# Patient Record
Sex: Female | Born: 1965 | Race: Black or African American | Hispanic: No | Smoking: Never smoker
Health system: Southern US, Community
[De-identification: ages and names within clinical notes are randomized; demographics above are authoritative.]

## PROBLEM LIST (undated history)

## (undated) DIAGNOSIS — E119 Type 2 diabetes mellitus without complications: Secondary | ICD-10-CM

## (undated) DIAGNOSIS — K5792 Diverticulitis of intestine, part unspecified, without perforation or abscess without bleeding: Secondary | ICD-10-CM

## (undated) HISTORY — DX: Diverticulitis of intestine, part unspecified, without perforation or abscess without bleeding: K57.92

## (undated) HISTORY — PX: TOTAL ABDOMINAL HYSTERECTOMY: SHX209

## (undated) HISTORY — DX: Type 2 diabetes mellitus without complications: E11.9

## (undated) HISTORY — PX: ABDOMINAL HYSTERECTOMY: SHX81

## (undated) HISTORY — PX: APPENDECTOMY: SHX54

---

## 2005-08-08 ENCOUNTER — Ambulatory Visit: Payer: Self-pay | Admitting: Family Medicine

## 2005-08-26 ENCOUNTER — Ambulatory Visit: Payer: Self-pay | Admitting: Family Medicine

## 2011-08-25 ENCOUNTER — Emergency Department: Payer: Self-pay | Admitting: Emergency Medicine

## 2017-02-11 ENCOUNTER — Ambulatory Visit: Payer: Self-pay | Attending: Oncology

## 2017-02-11 ENCOUNTER — Encounter (INDEPENDENT_AMBULATORY_CARE_PROVIDER_SITE_OTHER): Payer: Self-pay

## 2017-02-11 ENCOUNTER — Ambulatory Visit
Admission: RE | Admit: 2017-02-11 | Discharge: 2017-02-11 | Disposition: A | Discharge status: Home / Self Care | Payer: Self-pay | Source: Ambulatory Visit | Attending: Oncology | Admitting: Oncology

## 2017-02-11 VITALS — BP 126/83 | HR 89 | Temp 97.9°F | Ht 63.39 in | Wt 212.4 lb

## 2017-02-11 DIAGNOSIS — Z0000 Encounter for general adult medical examination without abnormal findings: Secondary | ICD-10-CM

## 2017-02-11 NOTE — Progress Notes (Signed)
Subjective:     Patient ID: Dimitri Ped, female   DOB: 1966-07-15, 51 y.o.   MRN: [id number]  HPI   Review of Systems     Objective:   Physical Exam  Pulmonary/Chest: Right breast exhibits no inverted nipple, no mass, no nipple discharge, no skin change and no tenderness. Left breast exhibits no inverted nipple, no mass, no nipple discharge, no skin change and no tenderness. Breasts are asymmetrical.  Right breast smaller than left       Assessment:     51 year old patient presents for [facility] clinic visit.  No previous mammogram. Patient screened, and meets BCCCP eligibility.  Patient does not have insurance, Medicare or Medicaid.  Handout given on Affordable Care Act.  Instructed patient on breast self-exam using teach back method.  CBE unremarkable.  No mass or lump palpated.  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    Plan:     Sent for bilateral screening mammogram.

## 2017-02-28 ENCOUNTER — Other Ambulatory Visit: Payer: Self-pay | Admitting: *Deleted

## 2017-02-28 DIAGNOSIS — R920 Mammographic microcalcification found on diagnostic imaging of breast: Secondary | ICD-10-CM

## 2017-02-28 DIAGNOSIS — N63 Unspecified lump in unspecified breast: Secondary | ICD-10-CM

## 2017-03-14 ENCOUNTER — Ambulatory Visit
Admission: RE | Admit: 2017-03-14 | Discharge: 2017-03-14 | Disposition: A | Discharge status: Home / Self Care | Payer: Self-pay | Source: Ambulatory Visit | Attending: Oncology | Admitting: Oncology

## 2017-03-14 DIAGNOSIS — N63 Unspecified lump in unspecified breast: Secondary | ICD-10-CM

## 2017-03-14 DIAGNOSIS — R920 Mammographic microcalcification found on diagnostic imaging of breast: Secondary | ICD-10-CM

## 2017-03-20 ENCOUNTER — Other Ambulatory Visit: Payer: Self-pay

## 2017-03-20 DIAGNOSIS — R920 Mammographic microcalcification found on diagnostic imaging of breast: Secondary | ICD-10-CM

## 2017-03-21 NOTE — Progress Notes (Signed)
Letter mailed to patient to return Thursday 09/19/17 at 11:20 for 6 month follow-up left breast mammogram.

## 2017-08-17 ENCOUNTER — Emergency Department: Payer: Self-pay

## 2017-08-17 ENCOUNTER — Emergency Department
Admission: EM | Admit: 2017-08-17 | Discharge: 2017-08-17 | Disposition: A | Discharge status: Home / Self Care | Payer: Self-pay | Attending: Emergency Medicine | Admitting: Emergency Medicine

## 2017-08-17 ENCOUNTER — Encounter: Payer: Self-pay | Admitting: Emergency Medicine

## 2017-08-17 DIAGNOSIS — R55 Syncope and collapse: Secondary | ICD-10-CM | POA: Insufficient documentation

## 2017-08-17 DIAGNOSIS — E1165 Type 2 diabetes mellitus with hyperglycemia: Secondary | ICD-10-CM | POA: Insufficient documentation

## 2017-08-17 DIAGNOSIS — Z7984 Long term (current) use of oral hypoglycemic drugs: Secondary | ICD-10-CM | POA: Insufficient documentation

## 2017-08-17 DIAGNOSIS — N3001 Acute cystitis with hematuria: Secondary | ICD-10-CM | POA: Insufficient documentation

## 2017-08-17 LAB — URINALYSIS, COMPLETE (UACMP) WITH MICROSCOPIC
BILIRUBIN URINE: NEGATIVE
Ketones, ur: NEGATIVE mg/dL
LEUKOCYTES UA: NEGATIVE
NITRITE: NEGATIVE
Protein, ur: 100 mg/dL — AB
SPECIFIC GRAVITY, URINE: 1.013 (ref 1.005–1.030)
pH: 5 (ref 5.0–8.0)

## 2017-08-17 LAB — HEPATIC FUNCTION PANEL
ALT: 16 U/L (ref 14–54)
AST: 17 U/L (ref 15–41)
Albumin: 3.5 g/dL (ref 3.5–5.0)
Alkaline Phosphatase: 75 U/L (ref 38–126)
TOTAL PROTEIN: 7.2 g/dL (ref 6.5–8.1)
Total Bilirubin: 0.4 mg/dL (ref 0.3–1.2)

## 2017-08-17 LAB — BASIC METABOLIC PANEL
ANION GAP: 9 (ref 5–15)
BUN: 9 mg/dL (ref 6–20)
CO2: 26 mmol/L (ref 22–32)
Calcium: 8.7 mg/dL — ABNORMAL LOW (ref 8.9–10.3)
Chloride: 99 mmol/L — ABNORMAL LOW (ref 101–111)
Creatinine, Ser: 0.79 mg/dL (ref 0.44–1.00)
GFR calc non Af Amer: >60 mL/min (ref >60)
Glucose, Bld: 372 mg/dL — ABNORMAL HIGH (ref 65–99)
Potassium: 4 mmol/L (ref 3.5–5.1)
Sodium: 134 mmol/L — ABNORMAL LOW (ref 135–145)

## 2017-08-17 LAB — CBC WITH DIFFERENTIAL/PLATELET
Basophils Absolute: 0 10*3/uL (ref 0–0.1)
Basophils Relative: 1 %
Eosinophils Absolute: 0 10*3/uL (ref 0–0.7)
Eosinophils Relative: 0 %
HEMATOCRIT: 34.5 % — AB (ref 35.0–47.0)
Hemoglobin: 11.5 g/dL — ABNORMAL LOW (ref 12.0–16.0)
Lymphocytes Relative: 23 %
Lymphs Abs: 2 10*3/uL (ref 1.0–3.6)
MCH: 28.7 pg (ref 26.0–34.0)
MCHC: 33.4 g/dL (ref 32.0–36.0)
MCV: 85.9 fL (ref 80.0–100.0)
MONO ABS: 0.9 10*3/uL (ref 0.2–0.9)
MONOS PCT: 10 %
NEUTROS ABS: 5.6 10*3/uL (ref 1.4–6.5)
Neutrophils Relative %: 66 %
Platelets: 341 10*3/uL (ref 150–440)
RBC: 4.02 MIL/uL (ref 3.80–5.20)
RDW: 13.4 % (ref 11.5–14.5)
WBC: 8.6 10*3/uL (ref 3.6–11.0)

## 2017-08-17 LAB — TSH: TSH: 1.005 u[IU]/mL (ref 0.350–4.500)

## 2017-08-17 LAB — TROPONIN I

## 2017-08-17 LAB — GLUCOSE, CAPILLARY
GLUCOSE-CAPILLARY: 300 mg/dL — AB (ref 65–99)
Glucose-Capillary: 230 mg/dL — ABNORMAL HIGH (ref 65–99)

## 2017-08-17 LAB — FIBRIN DERIVATIVES D-DIMER (ARMC ONLY): Fibrin derivatives D-dimer (ARMC): 449.94 (ref 0.00–499.00)

## 2017-08-17 MED ORDER — SODIUM CHLORIDE 0.9 % IV BOLUS (SEPSIS)
1000.0000 mL | Freq: Once | INTRAVENOUS | Status: AC
  Administered 2017-08-17: 1000 mL via INTRAVENOUS

## 2017-08-17 MED ORDER — METFORMIN HCL 500 MG PO TABS: 500.0000 mg | ORAL_TABLET | Freq: Two times a day (BID) | ORAL | 1 refills | Status: DC

## 2017-08-17 MED ORDER — ONDANSETRON HCL 4 MG/2ML IJ SOLN
4.0000 mg | Freq: Once | INTRAMUSCULAR | Status: AC
  Administered 2017-08-17: 4 mg via INTRAVENOUS
  Filled 2017-08-17: qty 2

## 2017-08-17 MED ORDER — CEPHALEXIN 500 MG PO CAPS: 500.0000 mg | ORAL_CAPSULE | Freq: Three times a day (TID) | ORAL | 0 refills | Status: AC

## 2017-08-17 MED ORDER — CEFTRIAXONE SODIUM IN DEXTROSE 20 MG/ML IV SOLN
1.0000 g | Freq: Once | INTRAVENOUS | Status: AC
  Administered 2017-08-17: 1 g via INTRAVENOUS

## 2017-08-17 MED ORDER — METFORMIN HCL 500 MG PO TABS
500.0000 mg | ORAL_TABLET | Freq: Once | ORAL | Status: AC
  Administered 2017-08-17: 500 mg via ORAL
  Filled 2017-08-17: qty 1

## 2017-08-17 MED ORDER — INSULIN ASPART 100 UNIT/ML ~~LOC~~ SOLN
10.0000 [IU] | Freq: Once | SUBCUTANEOUS | Status: AC
  Administered 2017-08-17: 10 [IU] via INTRAVENOUS
  Filled 2017-08-17: qty 1

## 2017-08-17 NOTE — Discharge Instructions (Signed)
Take antibiotics as prescribed. Take metformin twice a day with meals. Follow up with your doctor in 1-2 days for further management of your diabetes. Return to the ER for chest pain, palpitations, shortness of breath, headache, fever, or any symptoms concerning to you. Follow up with cardiology if you continue to have palpitations.

## 2017-08-17 NOTE — ED Notes (Signed)
Hooked patient up to monitor and did orthostatic vital signs.

## 2017-08-17 NOTE — ED Triage Notes (Addendum)
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 Patient states she has been taking a large amount of decongestants OTC for her cough and has decreased PO intake. Patient states she has not eaten since last night. Patient was unresponsive upon initial ems assessment however was easily roused by 1x ammonia inhalant and has remained A+Ox4.   Patient also c/o heart racing since last night

## 2017-08-17 NOTE — ED Provider Notes (Signed)
[facility] Emergency Department Provider Note  ____________________________________________  Time seen: Approximately 11:19 AM  I have reviewed the triage vital signs and the nursing notes.   HISTORY  Chief Complaint Loss of Consciousness   HPI Alice Clark is a 51 y.o. female with no significant PMH who presents for evaluation of syncope. Patient reports that she was in a meeting earlier this morning without having had any breakfast when she started to feel hot and dizzy and had a syncopal episode while sitting on a chair. No trauma. According to her daughter who was with her at the meeting patient was without consciousness for 10 minutes until EMS arrived. Patient denies any prior history of syncope other than when she was a little girl. She reports that she has been taking over-the-counter medication for a cough that she has had for the last 3 weeks. Since yesterday evening she started having palpitations that were ongoing throughout the night and present this morning as well. She had a near syncopal episode last night while getting up to go to the bathroom. She denies ever having palpitations in the past. She denies chest pain or shortness of breath, vomiting or diarrhea, fever or chills. Her cough is dry with no hemoptysis. No personal or family history of blood clots, no recent travel or immobilization, no leg pain or swelling.  History reviewed. No pertinent past medical history.  There are no active problems to display for this patient.   History reviewed. No pertinent surgical history.  Prior to Admission medications   Medication Sig Start Date End Date Taking? Authorizing Provider  cephALEXin (KEFLEX) 500 MG capsule Take 1 capsule (500 mg total) by mouth 3 (three) times daily. 08/17/17 08/24/17  Rudene Re, MD  metFORMIN (GLUCOPHAGE) 500 MG tablet Take 1 tablet (500 mg total) by mouth 2 (two) times daily with a meal. 08/17/17   Rudene Re, MD    Allergies Patient has no known allergies.  Family History  Problem Relation Age of Onset  . Breast cancer Neg Hx     Social History Social History  Substance Use Topics  . Smoking status: Not on file  . Smokeless tobacco: Not on file  . Alcohol use Not on file    Review of Systems  Constitutional: Negative for fever. + syncope Eyes: Negative for visual changes. ENT: Negative for sore throat. Neck: No neck pain  Cardiovascular: Negative for chest pain. Respiratory: Negative for shortness of breath. + cough Gastrointestinal: Negative for abdominal pain, vomiting or diarrhea. Genitourinary: Negative for dysuria. Musculoskeletal: Negative for back pain. Skin: Negative for rash. Neurological: Negative for headaches, weakness or numbness. Psych: No SI or HI  ____________________________________________   PHYSICAL EXAM:  VITAL SIGNS: ED Triage Vitals  Enc Vitals Group     BP 08/17/17 1050 110/79     Pulse Rate 08/17/17 1050 100     Resp 08/17/17 1051 (!) 27     Temp 08/17/17 1053 97.7 F (36.5 C)     Temp src --      SpO2 08/17/17 1050 98 %     Weight 08/17/17 1050 211 lb (95.7 kg)     Height 08/17/17 1050 5' (1.524 m)     Head Circumference --      Peak Flow --      Pain Score --      Pain Loc --      Pain Edu? --      Excl. in [place]? --  Constitutional: Alert and oriented. Well appearing and in no apparent distress. HEENT:      Head: Normocephalic and atraumatic.         Eyes: Conjunctivae are normal. Sclera is non-icteric.       Mouth/Throat: Mucous membranes are moist.       Neck: Supple with no signs of meningismus. Cardiovascular: Tachycardic with regular rhythm. No murmurs, gallops, or rubs. 2+ symmetrical distal pulses are present in all extremities. No JVD. Respiratory: Tachypneic. Normal respiratory effort. Lungs are clear to auscultation bilaterally. No wheezes, crackles, or rhonchi.  Gastrointestinal: Soft, non tender, and non  distended with positive bowel sounds. No rebound or guarding. Genitourinary: No CVA tenderness. Musculoskeletal: Nontender with normal range of motion in all extremities. No edema, cyanosis, or erythema of extremities. Neurologic: Normal speech and language. Face is symmetric. Moving all extremities. No gross focal neurologic deficits are appreciated. Skin: Skin is warm, dry and intact. No rash noted. Psychiatric: Mood and affect are normal. Speech and behavior are normal.  ____________________________________________   LABS (all labs ordered are listed, but only abnormal results are displayed)  Labs Reviewed  CBC WITH DIFFERENTIAL/PLATELET - Abnormal; Notable for the following:       Result Value   Hemoglobin 11.5 (*)    HCT 34.5 (*)    All other components within normal limits  BASIC METABOLIC PANEL - Abnormal; Notable for the following:    Sodium 134 (*)    Chloride 99 (*)    Glucose, Bld 372 (*)    Calcium 8.7 (*)    All other components within normal limits  HEPATIC FUNCTION PANEL - Abnormal; Notable for the following:    Bilirubin, Direct <0.1 (*)    All other components within normal limits  URINALYSIS, COMPLETE (UACMP) WITH MICROSCOPIC - Abnormal; Notable for the following:    Color, Urine YELLOW (*)    APPearance CLOUDY (*)    Glucose, UA >=500 (*)    Hgb urine dipstick SMALL (*)    Protein, ur 100 (*)    Bacteria, UA MANY (*)    Squamous Epithelial / LPF TOO NUMEROUS TO COUNT (*)    All other components within normal limits  GLUCOSE, CAPILLARY - Abnormal; Notable for the following:    Glucose-Capillary 300 (*)    All other components within normal limits  URINE CULTURE  TROPONIN I  TSH  FIBRIN DERIVATIVES D-DIMER ([facility] ONLY)  CBG MONITORING, ED  CBG MONITORING, ED   ____________________________________________  EKG  ED ECG REPORT I, Rudene Re, the attending physician, personally viewed and interpreted this ECG.  Normal sinus rhythm, normal  intervals, normal axis, no STE or depressions, no evidence of HOCM, AV block, delta wave, ARVD, prolonged QTc, WPW, or Brugada.   ____________________________________________  RADIOLOGY  CXR: Negative ____________________________________________   PROCEDURES  Procedure(s) performed: None Procedures Critical Care performed:  None ____________________________________________   INITIAL IMPRESSION / ASSESSMENT AND PLAN / ED COURSE  51 y.o. female with no significant PMH who presents for evaluation of syncope. Patient is well-appearing, in no distress, neuro intact, EKG with no evidence of concerning dysrhythmias. The patient does have tachypnea and has been complaining of a cough for 3 weeks. She is also borderline tachycardic therefore a d-dimer will be sent to rule out PE. Since she has been having palpitations since last night TSH will be sent to rule out thyroid disease, CBC to rule out anemia, CMP to rule out kidney injury or liver injury since patient has been taking  over-the-counter medications. Will monitor on telemetry, give IVF, and reassess  Clinical Course as of Aug 17 1528  Sat Aug 17, 2017  1300 patient's blood glucose is elevated at 372. According to the patient she was told many years ago that she should be on metformin which she has at home but has never taken it. She has family history of diabetes.patient has not have a physical exam in several years. I discussed with her that since she has not had anything to eat or drink since yesterday evening that this result of 372 is a fasting blood glucose and that is diagnostic of diabetes. I will give her the first dose of metformin here and recommend close follow-up with primary care doctor for further management of her diabetes. Remaining of her blood work including troponin, thyroid, d-dimer, CBC, LFTs are all within normal limits. Will recheck CBG after 1L NS  [CV]    Clinical Course User Index [CV] Alfred Levins, [place], MD     _________________________ 3:29 PM on 08/17/2017 -----------------------------------------  BG 230. Patient found to have UTI fro which she received rocephin. Will dc home on metformin, keflex, close f/u with PCP and referral to cardiology for palpitations. discussed strict return precautions with patient. We'll discharge home at this time.  Pertinent labs & imaging results that were available during my care of the patient were reviewed by me and considered in my medical decision making (see chart for details).    ____________________________________________   FINAL CLINICAL IMPRESSION(S) / ED DIAGNOSES  Final diagnoses:  Syncope, unspecified syncope type  Type 2 diabetes mellitus with hyperglycemia, without long-term current use of insulin (HCC)  Acute cystitis with hematuria      NEW MEDICATIONS STARTED DURING THIS VISIT:  New Prescriptions   CEPHALEXIN (KEFLEX) 500 MG CAPSULE    Take 1 capsule (500 mg total) by mouth 3 (three) times daily.   METFORMIN (GLUCOPHAGE) 500 MG TABLET    Take 1 tablet (500 mg total) by mouth 2 (two) times daily with a meal.     Note:  This document was prepared using Dragon voice recognition software and may include unintentional dictation errors.    Alfred Levins, [place], MD 08/17/17 [phone]

## 2017-08-18 LAB — URINE CULTURE

## 2017-08-21 ENCOUNTER — Telehealth: Payer: Self-pay

## 2017-08-21 NOTE — Telephone Encounter (Signed)
Lmov for patient to call back They were seen in ED on 08/17/17 Will try again at a later time

## 2017-10-01 ENCOUNTER — Ambulatory Visit
Admission: RE | Admit: 2017-10-01 | Discharge: 2017-10-01 | Disposition: A | Discharge status: Home / Self Care | Payer: Self-pay | Source: Ambulatory Visit | Attending: Oncology | Admitting: Oncology

## 2017-10-01 ENCOUNTER — Other Ambulatory Visit: Payer: Self-pay

## 2017-10-01 DIAGNOSIS — R920 Mammographic microcalcification found on diagnostic imaging of breast: Secondary | ICD-10-CM

## 2017-10-09 ENCOUNTER — Encounter: Payer: Self-pay | Admitting: *Deleted

## 2017-10-09 ENCOUNTER — Other Ambulatory Visit: Payer: Self-pay | Admitting: *Deleted

## 2017-10-09 DIAGNOSIS — R920 Mammographic microcalcification found on diagnostic imaging of breast: Secondary | ICD-10-CM

## 2017-10-09 NOTE — Progress Notes (Signed)
Mailed patient a letter with the results of her six month follow-up mammogram.  Next appointment scheduled for her annual and 6 month follow-up on 03/19/18 @ 9:30 for BCCCP and 11:00 for her mammogram.  HSIS to [place].

## 2017-10-16 ENCOUNTER — Ambulatory Visit: Payer: Self-pay | Admitting: Internal Medicine

## 2017-12-13 ENCOUNTER — Ambulatory Visit (INDEPENDENT_AMBULATORY_CARE_PROVIDER_SITE_OTHER): Payer: Self-pay | Admitting: Cardiovascular Disease

## 2017-12-13 ENCOUNTER — Encounter: Payer: Self-pay | Admitting: Cardiovascular Disease

## 2017-12-13 VITALS — BP 122/76 | HR 98 | Ht 60.0 in | Wt 196.0 lb

## 2017-12-13 DIAGNOSIS — R55 Syncope and collapse: Secondary | ICD-10-CM

## 2017-12-13 DIAGNOSIS — Z7689 Persons encountering health services in other specified circumstances: Secondary | ICD-10-CM

## 2017-12-13 NOTE — Progress Notes (Signed)
Cardiology Office Note   Date:  12/13/2017   ID:  Alice Clark, DOB 1966-09-12, MRN [id number]  PCP:  Donnie Coffin, MD  Cardiologist:   Kathlyn Sacramento, MD   Chief Complaint  Patient presents with  . other    ED syncope. Meds reviewed verbally with pt.      History of Present Illness: Alice Clark is a 52 y.o. female who was referred from [facility] ED for evaluation of syncope.  She has no significant past medical history. She had a syncopal episode in September .  She was not feeling well at that time with symptoms suggestive of upper respiratory tract infection with cough.  She was taking over-the-counter cough medications.  The night before the episode, she felt that her heart was racing.  She went to sleep and woke up in the morning not feeling well but she continued to feel that her heart was racing.  She did not have breakfast and went to attend a meeting.  While she was there, she felt extremely hot followed by dizziness and then a syncopal episode that lasted for a few minutes.  She was taken to the emergency room.  She was found to have a urinary tract infection.  Labs were unremarkable except for a glucose of 372.  The patient was given antibiotic with subsequent improvement.  She has been doing well since then with no recurrent symptoms of dizziness, presyncope or syncope.  No chest pain or shortness of breath.  She had some localized left-sided chest pain and chest x-ray was suspicious for a lytic lesion and thus the patient is supposed to follow-up with oncology for evaluation. She is not a smoker and has no family history of coronary artery disease, arrhythmia or sudden death.   Past Medical History:  Diagnosis Date  . Diabetes mellitus without complication ([place])   . Diverticulitis     Past Surgical History:  Procedure Laterality Date  . ABDOMINAL HYSTERECTOMY    . APPENDECTOMY    . CESAREAN SECTION    . TOTAL ABDOMINAL HYSTERECTOMY       Current Outpatient  Medications  Medication Sig Dispense Refill  . metFORMIN (GLUCOPHAGE) 500 MG tablet Take 1 tablet (500 mg total) by mouth 2 (two) times daily with a meal. 60 tablet 1   No current facility-administered medications for this visit.     Allergies:   Patient has no known allergies.    Social History:  The patient  reports that  has never smoked. she has never used smokeless tobacco. She reports that she does not drink alcohol or use drugs.   Family History:  The patient's family history is negative for coronary artery disease, arrhythmia or sudden death   ROS:  Please see the history of present illness.   Otherwise, review of systems are positive for none.   All other systems are reviewed and negative.    PHYSICAL EXAM: VS:  BP 122/76 (BP Location: Right Arm, Patient Position: Sitting, Cuff Size: Large)   Pulse 98   Ht 5' (1.524 m)   Wt 196 lb (88.9 kg)   BMI 38.28 kg/m  , BMI Body mass index is 38.28 kg/m. GEN: Well nourished, well developed, in no acute distress  HEENT: normal  Neck: no JVD, carotid bruits, or masses Cardiac: RRR; no murmurs, rubs, or gallops,no edema  Respiratory:  clear to auscultation bilaterally, normal work of breathing GI: soft, nontender, nondistended, + BS MS: no deformity or atrophy  Skin: warm and dry, no rash Neuro:  Strength and sensation are intact Psych: euthymic mood, full affect   EKG:  EKG is ordered today. The ekg ordered today demonstrates normal sinus rhythm T wave changes.  Low voltage in precordial leads.   Recent Labs: 08/17/2017: ALT 16; BUN 9; Creatinine, Ser 0.79; Hemoglobin 11.5; Platelets 341; Potassium 4.0; Sodium 134; TSH 1.005    Lipid Panel No results found for: CHOL, TRIG, HDL, CHOLHDL, VLDL, LDLCALC, LDLDIRECT    Wt Readings from Last 3 Encounters:  12/13/17 196 lb (88.9 kg)  08/17/17 211 lb (95.7 kg)  02/11/17 212 lb 6.6 oz (96.3 kg)      PAD Screen 12/13/2017  Previous PAD dx? No  Previous surgical procedure?  Yes  Pain with walking? No  Feet/toe relief with dangling? No  Painful, non-healing ulcers? No  Extremities discolored? No      ASSESSMENT AND PLAN:  1.  Vasovagal syncope: The patient's history is highly suggestive of vasovagal syncope in the setting of urinary tract infection and hyperglycemia.  She had no recurrent episodes.  She has no cardiac murmurs by exam and EKG is overall unremarkable.  Thus, no further workup is recommended unless she has  recurrent episodes.  2.  Diabetes mellitus: Followed by primary care physician and currently she is on metformin.  3.  Questionable lytic lesion the rib.:  The patient is aware that further follow-up with oncology or GYN is recommended given possible right adnexal mass    Disposition:   FU with me as needed.   Signed,  Kathlyn Sacramento, MD  12/13/2017 9:42 AM    [place]

## 2017-12-13 NOTE — Patient Instructions (Signed)
Medication Instructions: No change.   Labwork: None.   Procedures/Testing: None.   Follow-Up: As needed with Dr. Aydien Majette.   Any Additional Special Instructions Will Be Listed Below (If Applicable).     If you need a refill on your cardiac medications before your next appointment, please call your pharmacy.   

## 2018-03-19 ENCOUNTER — Other Ambulatory Visit: Payer: Self-pay

## 2018-03-19 ENCOUNTER — Ambulatory Visit: Payer: Medicaid Other | Attending: Oncology

## 2018-06-29 ENCOUNTER — Emergency Department

## 2018-06-29 ENCOUNTER — Other Ambulatory Visit: Payer: Self-pay

## 2018-06-29 ENCOUNTER — Inpatient Hospital Stay
Admission: EM | Admit: 2018-06-29 | Discharge: 2018-07-04 | DRG: 391 | Disposition: A | Discharge status: Home / Self Care | Attending: Internal Medicine | Admitting: Internal Medicine

## 2018-06-29 DIAGNOSIS — Z515 Encounter for palliative care: Secondary | ICD-10-CM | POA: Diagnosis present

## 2018-06-29 DIAGNOSIS — D62 Acute posthemorrhagic anemia: Secondary | ICD-10-CM | POA: Diagnosis present

## 2018-06-29 DIAGNOSIS — I248 Other forms of acute ischemic heart disease: Secondary | ICD-10-CM | POA: Diagnosis present

## 2018-06-29 DIAGNOSIS — Z90710 Acquired absence of both cervix and uterus: Secondary | ICD-10-CM

## 2018-06-29 DIAGNOSIS — K5900 Constipation, unspecified: Secondary | ICD-10-CM | POA: Diagnosis present

## 2018-06-29 DIAGNOSIS — Z66 Do not resuscitate: Secondary | ICD-10-CM | POA: Diagnosis present

## 2018-06-29 DIAGNOSIS — R64 Cachexia: Secondary | ICD-10-CM | POA: Diagnosis present

## 2018-06-29 DIAGNOSIS — Y842 Radiological procedure and radiotherapy as the cause of abnormal reaction of the patient, or of later complication, without mention of misadventure at the time of the procedure: Secondary | ICD-10-CM | POA: Diagnosis present

## 2018-06-29 DIAGNOSIS — C569 Malignant neoplasm of unspecified ovary: Secondary | ICD-10-CM | POA: Diagnosis present

## 2018-06-29 DIAGNOSIS — E43 Unspecified severe protein-calorie malnutrition: Secondary | ICD-10-CM | POA: Diagnosis present

## 2018-06-29 DIAGNOSIS — R0789 Other chest pain: Secondary | ICD-10-CM | POA: Diagnosis present

## 2018-06-29 DIAGNOSIS — C7951 Secondary malignant neoplasm of bone: Secondary | ICD-10-CM | POA: Diagnosis present

## 2018-06-29 DIAGNOSIS — A419 Sepsis, unspecified organism: Secondary | ICD-10-CM | POA: Diagnosis present

## 2018-06-29 DIAGNOSIS — Z7952 Long term (current) use of systemic steroids: Secondary | ICD-10-CM

## 2018-06-29 DIAGNOSIS — E119 Type 2 diabetes mellitus without complications: Secondary | ICD-10-CM | POA: Diagnosis present

## 2018-06-29 DIAGNOSIS — R079 Chest pain, unspecified: Secondary | ICD-10-CM | POA: Diagnosis present

## 2018-06-29 DIAGNOSIS — Z79899 Other long term (current) drug therapy: Secondary | ICD-10-CM

## 2018-06-29 DIAGNOSIS — E876 Hypokalemia: Secondary | ICD-10-CM | POA: Diagnosis present

## 2018-06-29 DIAGNOSIS — K208 Other esophagitis: Principal | ICD-10-CM | POA: Diagnosis present

## 2018-06-29 DIAGNOSIS — Z7189 Other specified counseling: Secondary | ICD-10-CM

## 2018-06-29 DIAGNOSIS — J189 Pneumonia, unspecified organism: Secondary | ICD-10-CM | POA: Diagnosis present

## 2018-06-29 DIAGNOSIS — R0600 Dyspnea, unspecified: Secondary | ICD-10-CM

## 2018-06-29 DIAGNOSIS — Z6822 Body mass index (BMI) 22.0-22.9, adult: Secondary | ICD-10-CM

## 2018-06-29 DIAGNOSIS — L8990 Pressure ulcer of unspecified site, unspecified stage: Secondary | ICD-10-CM

## 2018-06-29 DIAGNOSIS — K921 Melena: Secondary | ICD-10-CM | POA: Diagnosis present

## 2018-06-29 DIAGNOSIS — T39395A Adverse effect of other nonsteroidal anti-inflammatory drugs [NSAID], initial encounter: Secondary | ICD-10-CM | POA: Diagnosis present

## 2018-06-29 DIAGNOSIS — Z881 Allergy status to other antibiotic agents status: Secondary | ICD-10-CM

## 2018-06-29 DIAGNOSIS — T451X5A Adverse effect of antineoplastic and immunosuppressive drugs, initial encounter: Secondary | ICD-10-CM | POA: Diagnosis present

## 2018-06-29 DIAGNOSIS — K219 Gastro-esophageal reflux disease without esophagitis: Secondary | ICD-10-CM | POA: Diagnosis present

## 2018-06-29 DIAGNOSIS — Z7984 Long term (current) use of oral hypoglycemic drugs: Secondary | ICD-10-CM

## 2018-06-29 DIAGNOSIS — I959 Hypotension, unspecified: Secondary | ICD-10-CM | POA: Diagnosis present

## 2018-06-29 DIAGNOSIS — R1310 Dysphagia, unspecified: Secondary | ICD-10-CM | POA: Diagnosis present

## 2018-06-29 DIAGNOSIS — Z923 Personal history of irradiation: Secondary | ICD-10-CM

## 2018-06-29 DIAGNOSIS — Z8719 Personal history of other diseases of the digestive system: Secondary | ICD-10-CM

## 2018-06-29 DIAGNOSIS — K922 Gastrointestinal hemorrhage, unspecified: Secondary | ICD-10-CM

## 2018-06-29 DIAGNOSIS — G893 Neoplasm related pain (acute) (chronic): Secondary | ICD-10-CM | POA: Diagnosis present

## 2018-06-29 DIAGNOSIS — C800 Disseminated malignant neoplasm, unspecified: Secondary | ICD-10-CM | POA: Diagnosis not present

## 2018-06-29 DIAGNOSIS — L89310 Pressure ulcer of right buttock, unstageable: Secondary | ICD-10-CM | POA: Diagnosis present

## 2018-06-29 DIAGNOSIS — R000 Tachycardia, unspecified: Secondary | ICD-10-CM | POA: Diagnosis present

## 2018-06-29 DIAGNOSIS — K5903 Drug induced constipation: Secondary | ICD-10-CM | POA: Diagnosis not present

## 2018-06-29 DIAGNOSIS — Z9221 Personal history of antineoplastic chemotherapy: Secondary | ICD-10-CM

## 2018-06-29 DIAGNOSIS — C787 Secondary malignant neoplasm of liver and intrahepatic bile duct: Secondary | ICD-10-CM | POA: Diagnosis present

## 2018-06-29 DIAGNOSIS — Z95828 Presence of other vascular implants and grafts: Secondary | ICD-10-CM

## 2018-06-29 DIAGNOSIS — R5381 Other malaise: Secondary | ICD-10-CM | POA: Diagnosis present

## 2018-06-29 LAB — COMPREHENSIVE METABOLIC PANEL
ALBUMIN: 1.4 g/dL — AB (ref 3.5–5.0)
ALK PHOS: 251 U/L — AB (ref 38–126)
ALK PHOS: 215 U/L — AB (ref 38–126)
ALT: 15 U/L (ref 0–44)
ALT: 13 U/L (ref 0–44)
ANION GAP: 12 (ref 5–15)
AST: 26 U/L (ref 15–41)
AST: 24 U/L (ref 15–41)
Albumin: 1.4 g/dL — ABNORMAL LOW (ref 3.5–5.0)
Anion gap: 10 (ref 5–15)
BILIRUBIN TOTAL: 1.8 mg/dL — AB (ref 0.3–1.2)
BILIRUBIN TOTAL: 1.8 mg/dL — AB (ref 0.3–1.2)
BUN: 19 mg/dL (ref 6–20)
BUN: 17 mg/dL (ref 6–20)
CALCIUM: 6.1 mg/dL — AB (ref 8.9–10.3)
CALCIUM: 5.7 mg/dL — AB (ref 8.9–10.3)
CO2: 25 mmol/L (ref 22–32)
CO2: 25 mmol/L (ref 22–32)
CREATININE: 0.51 mg/dL (ref 0.44–1.00)
Chloride: 97 mmol/L — ABNORMAL LOW (ref 98–111)
Chloride: 101 mmol/L (ref 98–111)
Creatinine, Ser: 0.48 mg/dL (ref 0.44–1.00)
GFR calc Af Amer: >60 mL/min (ref >60)
GLUCOSE: 141 mg/dL — AB (ref 70–99)
Glucose, Bld: 136 mg/dL — ABNORMAL HIGH (ref 70–99)
POTASSIUM: 2.7 mmol/L — AB (ref 3.5–5.1)
Potassium: 2.4 mmol/L — CL (ref 3.5–5.1)
Sodium: 134 mmol/L — ABNORMAL LOW (ref 135–145)
Sodium: 136 mmol/L (ref 135–145)
TOTAL PROTEIN: 6.3 g/dL — AB (ref 6.5–8.1)
Total Protein: 5.9 g/dL — ABNORMAL LOW (ref 6.5–8.1)

## 2018-06-29 LAB — TROPONIN I
TROPONIN I: 0.46 ng/mL — AB (ref <0.03)
Troponin I: 0.08 ng/mL (ref <0.03)
Troponin I: 0.09 ng/mL (ref <0.03)

## 2018-06-29 LAB — GLUCOSE, CAPILLARY: GLUCOSE-CAPILLARY: 92 mg/dL (ref 70–99)

## 2018-06-29 LAB — PREPARE RBC (CROSSMATCH)

## 2018-06-29 LAB — CBC WITH DIFFERENTIAL/PLATELET
BASOS PCT: 0 %
Basophils Absolute: 0 10*3/uL (ref 0–0.1)
Basophils Absolute: 0 10*3/uL (ref 0–0.1)
Basophils Relative: 0 %
EOS ABS: 0 10*3/uL (ref 0–0.7)
EOS PCT: 0 %
Eosinophils Absolute: 0 10*3/uL (ref 0–0.7)
Eosinophils Relative: 0 %
HCT: 21.6 % — ABNORMAL LOW (ref 35.0–47.0)
HCT: 19.5 % — ABNORMAL LOW (ref 35.0–47.0)
HEMOGLOBIN: 7.1 g/dL — AB (ref 12.0–16.0)
HEMOGLOBIN: 6.3 g/dL — AB (ref 12.0–16.0)
LYMPHS PCT: 4 %
LYMPHS PCT: 3 %
Lymphs Abs: 1.5 10*3/uL (ref 1.0–3.6)
Lymphs Abs: 1 10*3/uL (ref 1.0–3.6)
MCH: 30 pg (ref 26.0–34.0)
MCH: 29.6 pg (ref 26.0–34.0)
MCHC: 33.1 g/dL (ref 32.0–36.0)
MCHC: 32.3 g/dL (ref 32.0–36.0)
MCV: 90.6 fL (ref 80.0–100.0)
MCV: 91.7 fL (ref 80.0–100.0)
MONOS PCT: 5 %
Monocytes Absolute: 1.9 10*3/uL — ABNORMAL HIGH (ref 0.2–0.9)
Monocytes Absolute: 1.6 10*3/uL — ABNORMAL HIGH (ref 0.2–0.9)
Monocytes Relative: 5 %
NEUTROS ABS: 35.3 10*3/uL — AB (ref 1.4–6.5)
NEUTROS PCT: 92 %
Neutro Abs: 29.6 10*3/uL — ABNORMAL HIGH (ref 1.4–6.5)
Neutrophils Relative %: 91 %
PLATELETS: 195 10*3/uL (ref 150–440)
Platelets: 171 10*3/uL (ref 150–440)
RBC: 2.38 MIL/uL — ABNORMAL LOW (ref 3.80–5.20)
RBC: 2.13 MIL/uL — AB (ref 3.80–5.20)
RDW: 18.2 % — AB (ref 11.5–14.5)
RDW: 18.8 % — ABNORMAL HIGH (ref 11.5–14.5)
WBC: 38.7 10*3/uL — ABNORMAL HIGH (ref 3.6–11.0)
WBC: 32.2 10*3/uL — AB (ref 3.6–11.0)

## 2018-06-29 LAB — HEMOGLOBIN AND HEMATOCRIT, BLOOD
HCT: 23.6 % — ABNORMAL LOW (ref 35.0–47.0)
Hemoglobin: 7.9 g/dL — ABNORMAL LOW (ref 12.0–16.0)

## 2018-06-29 LAB — URINALYSIS, COMPLETE (UACMP) WITH MICROSCOPIC
Bacteria, UA: NONE SEEN
Bilirubin Urine: NEGATIVE
Glucose, UA: NEGATIVE mg/dL
Ketones, ur: NEGATIVE mg/dL
LEUKOCYTES UA: NEGATIVE
Nitrite: NEGATIVE
PH: 6 (ref 5.0–8.0)
Protein, ur: 30 mg/dL — AB
SPECIFIC GRAVITY, URINE: 1.01 (ref 1.005–1.030)

## 2018-06-29 LAB — MAGNESIUM: Magnesium: 1.2 mg/dL — ABNORMAL LOW (ref 1.7–2.4)

## 2018-06-29 LAB — PROTIME-INR
INR: 1.6
INR: 1.64
PROTHROMBIN TIME: 18.9 s — AB (ref 11.4–15.2)
PROTHROMBIN TIME: 19.3 s — AB (ref 11.4–15.2)

## 2018-06-29 LAB — ABO/RH: ABO/RH(D): O POS

## 2018-06-29 LAB — LACTIC ACID, PLASMA
LACTIC ACID, VENOUS: 1.7 mmol/L (ref 0.5–1.9)
LACTIC ACID, VENOUS: 1.2 mmol/L (ref 0.5–1.9)

## 2018-06-29 LAB — APTT: aPTT: 43 seconds — ABNORMAL HIGH (ref 24–36)

## 2018-06-29 LAB — PROCALCITONIN: PROCALCITONIN: 5.55 ng/mL

## 2018-06-29 LAB — LIPASE, BLOOD: LIPASE: 13 U/L (ref 11–51)

## 2018-06-29 MED ORDER — FENTANYL CITRATE (PF) 100 MCG/2ML IJ SOLN
50.0000 ug | INTRAMUSCULAR | Status: DC | PRN
Start: 2018-06-29 — End: 2018-07-04
  Administered 2018-06-29 – 2018-07-01 (×3): 50 ug via INTRAVENOUS
  Filled 2018-06-29 (×5): qty 2

## 2018-06-29 MED ORDER — POTASSIUM CHLORIDE 10 MEQ/100ML IV SOLN
10.0000 meq | INTRAVENOUS | Status: AC
  Administered 2018-06-29 (×3): 10 meq via INTRAVENOUS
  Filled 2018-06-29 (×3): qty 100

## 2018-06-29 MED ORDER — SODIUM CHLORIDE 0.9 % IV SOLN: 80.0000 mg | Freq: Once | INTRAVENOUS | Status: DC

## 2018-06-29 MED ORDER — NYSTATIN 100000 UNIT/ML MT SUSP
5.0000 mL | Freq: Two times a day (BID) | OROMUCOSAL | Status: DC
  Administered 2018-06-30 – 2018-07-04 (×8): 500000 [IU] via ORAL
  Filled 2018-06-29 (×8): qty 5

## 2018-06-29 MED ORDER — SUCRALFATE 1 GM/10ML PO SUSP
1.0000 g | Freq: Four times a day (QID) | ORAL | Status: DC
  Administered 2018-06-30 – 2018-07-04 (×11): 1 g via ORAL
  Filled 2018-06-29 (×15): qty 10

## 2018-06-29 MED ORDER — CALCIUM POLYCARBOPHIL 625 MG PO TABS
625.0000 mg | ORAL_TABLET | Freq: Every day | ORAL | Status: DC
  Administered 2018-07-01 – 2018-07-02 (×2): 625 mg via ORAL
  Filled 2018-06-29 (×6): qty 1

## 2018-06-29 MED ORDER — SODIUM CHLORIDE 0.9 % IV BOLUS
1000.0000 mL | Freq: Once | INTRAVENOUS | Status: AC
  Administered 2018-06-29: 1000 mL via INTRAVENOUS

## 2018-06-29 MED ORDER — INSULIN ASPART 100 UNIT/ML ~~LOC~~ SOLN: 0.0000 [IU] | Freq: Every day | SUBCUTANEOUS | Status: DC

## 2018-06-29 MED ORDER — SODIUM CHLORIDE 0.9 % IV SOLN
1.0000 g | Freq: Once | INTRAVENOUS | Status: AC
  Administered 2018-06-29: 1 g via INTRAVENOUS
  Filled 2018-06-29: qty 10

## 2018-06-29 MED ORDER — DEXAMETHASONE SODIUM PHOSPHATE 4 MG/ML IJ SOLN
2.0000 mg | Freq: Two times a day (BID) | INTRAMUSCULAR | Status: DC
  Administered 2018-06-29 – 2018-07-01 (×4): 2 mg via INTRAVENOUS
  Filled 2018-06-29 (×4): qty 1

## 2018-06-29 MED ORDER — VANCOMYCIN HCL IN DEXTROSE 1-5 GM/200ML-% IV SOLN
1000.0000 mg | Freq: Once | INTRAVENOUS | Status: AC
  Administered 2018-06-29: 1000 mg via INTRAVENOUS
  Filled 2018-06-29: qty 200

## 2018-06-29 MED ORDER — VANCOMYCIN HCL IN DEXTROSE 1-5 GM/200ML-% IV SOLN
1000.0000 mg | Freq: Two times a day (BID) | INTRAVENOUS | Status: DC
  Administered 2018-06-29 – 2018-06-30 (×3): 1000 mg via INTRAVENOUS
  Filled 2018-06-29 (×4): qty 200

## 2018-06-29 MED ORDER — PIPERACILLIN-TAZOBACTAM 3.375 G IVPB
3.3750 g | Freq: Three times a day (TID) | INTRAVENOUS | Status: DC
  Administered 2018-06-29 – 2018-07-04 (×14): 3.375 g via INTRAVENOUS
  Filled 2018-06-29 (×14): qty 50

## 2018-06-29 MED ORDER — POTASSIUM CHLORIDE CRYS ER 20 MEQ PO TBCR: 40.0000 meq | EXTENDED_RELEASE_TABLET | Freq: Once | ORAL | Status: DC

## 2018-06-29 MED ORDER — SODIUM CHLORIDE 0.9 % IV SOLN
80.0000 mg | Freq: Once | INTRAVENOUS | Status: AC
  Administered 2018-06-29: 80 mg via INTRAVENOUS
  Filled 2018-06-29: qty 80

## 2018-06-29 MED ORDER — SODIUM CHLORIDE 0.9 % IV SOLN: 8.0000 mg/h | INTRAVENOUS | Status: DC

## 2018-06-29 MED ORDER — ACETAMINOPHEN 325 MG PO TABS: 650.0000 mg | ORAL_TABLET | Freq: Once | ORAL | Status: DC

## 2018-06-29 MED ORDER — PIPERACILLIN-TAZOBACTAM 3.375 G IVPB 30 MIN
3.3750 g | Freq: Once | INTRAVENOUS | Status: AC
  Administered 2018-06-29: 3.375 g via INTRAVENOUS
  Filled 2018-06-29: qty 50

## 2018-06-29 MED ORDER — SODIUM CHLORIDE 0.9 % IV SOLN
10.0000 mL/h | Freq: Once | INTRAVENOUS | Status: AC
  Administered 2018-06-29: 10 mL/h via INTRAVENOUS

## 2018-06-29 MED ORDER — MAGNESIUM HYDROXIDE 400 MG/5ML PO SUSP
30.0000 mL | Freq: Every day | ORAL | Status: DC
  Administered 2018-07-01 – 2018-07-02 (×2): 30 mL via ORAL
  Filled 2018-06-29 (×7): qty 30

## 2018-06-29 MED ORDER — ONDANSETRON HCL 4 MG/2ML IJ SOLN
4.0000 mg | Freq: Four times a day (QID) | INTRAMUSCULAR | Status: DC | PRN
  Filled 2018-06-29: qty 2

## 2018-06-29 MED ORDER — SODIUM CHLORIDE 0.9% IV SOLUTION
Freq: Once | INTRAVENOUS | Status: AC
  Administered 2018-06-29: 16:00:00 via INTRAVENOUS
  Filled 2018-06-29: qty 250

## 2018-06-29 MED ORDER — FUROSEMIDE 10 MG/ML IJ SOLN
20.0000 mg | Freq: Once | INTRAMUSCULAR | Status: AC
  Administered 2018-06-30: 20 mg via INTRAVENOUS
  Filled 2018-06-29 (×2): qty 2

## 2018-06-29 MED ORDER — ACETAMINOPHEN 325 MG PO TABS
650.0000 mg | ORAL_TABLET | Freq: Once | ORAL | Status: AC
Start: 2018-06-29 — End: 2018-06-29
  Administered 2018-06-29: 650 mg via ORAL
  Filled 2018-06-29: qty 2

## 2018-06-29 MED ORDER — POTASSIUM CHLORIDE 20 MEQ/15ML (10%) PO SOLN
10.0000 meq | Freq: Every day | ORAL | Status: DC
  Filled 2018-06-29 (×4): qty 15

## 2018-06-29 MED ORDER — ACETAMINOPHEN 650 MG RE SUPP: 650.0000 mg | Freq: Four times a day (QID) | RECTAL | Status: DC | PRN

## 2018-06-29 MED ORDER — ACETAMINOPHEN 325 MG PO TABS
650.0000 mg | ORAL_TABLET | Freq: Four times a day (QID) | ORAL | Status: DC | PRN
  Administered 2018-07-02: 22:00:00 650 mg via ORAL
  Filled 2018-06-29: qty 2

## 2018-06-29 MED ORDER — HYDROCODONE-ACETAMINOPHEN 5-325 MG PO TABS: 1.0000 | ORAL_TABLET | ORAL | Status: DC | PRN

## 2018-06-29 MED ORDER — PANTOPRAZOLE SODIUM 40 MG IV SOLR: 40.0000 mg | Freq: Two times a day (BID) | INTRAVENOUS | Status: DC

## 2018-06-29 MED ORDER — SODIUM CHLORIDE 0.9 % IV SOLN
8.0000 mg/h | INTRAVENOUS | Status: DC
  Administered 2018-06-29 – 2018-06-30 (×3): 8 mg/h via INTRAVENOUS
  Filled 2018-06-29 (×4): qty 80

## 2018-06-29 MED ORDER — POTASSIUM CHLORIDE IN NACL 40-0.9 MEQ/L-% IV SOLN
INTRAVENOUS | Status: AC
  Filled 2018-06-29 (×2): qty 1000

## 2018-06-29 MED ORDER — ONDANSETRON HCL 4 MG/2ML IJ SOLN
4.0000 mg | Freq: Once | INTRAMUSCULAR | Status: AC
  Administered 2018-06-29: 4 mg via INTRAVENOUS
  Filled 2018-06-29: qty 2

## 2018-06-29 MED ORDER — ONDANSETRON HCL 4 MG PO TABS
4.0000 mg | ORAL_TABLET | Freq: Four times a day (QID) | ORAL | Status: DC | PRN
Start: 2018-06-29 — End: 2018-07-04

## 2018-06-29 MED ORDER — FLORANEX PO PACK
1.0000 g | PACK | Freq: Three times a day (TID) | ORAL | Status: DC
  Administered 2018-07-01 – 2018-07-04 (×4): 1 g via ORAL
  Filled 2018-06-29 (×20): qty 1

## 2018-06-29 MED ORDER — INSULIN ASPART 100 UNIT/ML ~~LOC~~ SOLN
0.0000 [IU] | Freq: Three times a day (TID) | SUBCUTANEOUS | Status: DC
Start: 2018-06-29 — End: 2018-07-04
  Administered 2018-06-30: 08:00:00 2 [IU] via SUBCUTANEOUS
  Administered 2018-06-30: 1 [IU] via SUBCUTANEOUS
  Administered 2018-07-02 – 2018-07-03 (×3): 2 [IU] via SUBCUTANEOUS
  Administered 2018-07-03 – 2018-07-04 (×2): 1 [IU] via SUBCUTANEOUS
  Filled 2018-06-29 (×9): qty 1

## 2018-06-29 MED ORDER — POTASSIUM CHLORIDE 10 MEQ/100ML IV SOLN
10.0000 meq | Freq: Once | INTRAVENOUS | Status: AC
  Administered 2018-06-29: 21:00:00 10 meq via INTRAVENOUS
  Filled 2018-06-29: qty 100

## 2018-06-29 MED ORDER — DEXAMETHASONE 4 MG PO TABS: 2.0000 mg | ORAL_TABLET | Freq: Two times a day (BID) | ORAL | Status: DC

## 2018-06-29 MED ORDER — VANCOMYCIN HCL IN DEXTROSE 1-5 GM/200ML-% IV SOLN: 1000.0000 mg | Freq: Once | INTRAVENOUS | Status: DC

## 2018-06-29 MED ORDER — PIPERACILLIN-TAZOBACTAM 3.375 G IVPB 30 MIN: 3.3750 g | Freq: Once | INTRAVENOUS | Status: DC

## 2018-06-29 MED ORDER — DIPHENHYDRAMINE HCL 25 MG PO CAPS
25.0000 mg | ORAL_CAPSULE | Freq: Once | ORAL | Status: DC
  Filled 2018-06-29: qty 1

## 2018-06-29 MED ORDER — GI COCKTAIL ~~LOC~~
30.0000 mL | Freq: Once | ORAL | Status: DC
  Filled 2018-06-29: qty 30

## 2018-06-29 NOTE — Progress Notes (Signed)
Chaplain received an OR for spiritual care for the patient. Upon entering patient's room, family was at the bedside; patient appeared to be resting comfortably. Family indicated that they were not in need of anything at the time. Family encouraged to page if needed. Will continue to keep the patient lifted in prayer.    06/29/18 1600  Clinical Encounter Type  Visited With Family  Visit Type Initial

## 2018-06-29 NOTE — ED Notes (Signed)
.  Labs sent at this time. RN will monitor for results.   

## 2018-06-29 NOTE — ED Triage Notes (Signed)
Pt arrives via [facility] from home  for chest pain since 5 Am sub sternal non raidiating 7-10/10. last chemo tx was a mth ago,  51mcg fentaly  324 asprin  0.4 nitro Pt is on hospice, hospice RN is the one who called.   VS   12 wnl 124/69 100% ra

## 2018-06-29 NOTE — Progress Notes (Signed)
CODE SEPSIS - PHARMACY COMMUNICATION  **Broad Spectrum Antibiotics should be administered within 1 hour of Sepsis diagnosis**  Time Code Sepsis Called/Page Received: @1044   Antibiotics Ordered: Vancomycin                                      Zosyn   Time of 1st antibiotic administration: @1130   Additional action taken by pharmacy: Spoke with nurse at 1120 about time remaining for Abx to administration to meet 1 hour   If necessary, Name of Provider/Nurse Contacted:Cassie    Pernell Dupre, PharmD, BCPS Clinical Pharmacist 06/29/2018 11:36 AM

## 2018-06-29 NOTE — ED Notes (Signed)
Pt had black tarry stool upon assessment pt was changed and EDP aware. Pt is positive hemoccult.

## 2018-06-29 NOTE — Progress Notes (Signed)
Family Meeting Note  Advance Directive:yes  Today a meeting took place with the Patient.  Patient is able to participate   The following clinical team members were present during this meeting:MD  The following were discussed:Patient's diagnosis: Ovarian cancer, Patient's progosis: Unable to determine and Goals for treatment: DNR  Additional follow-up to be provided: prn  Time spent during discussion:20 minutes  Gorden Harms, MD

## 2018-06-29 NOTE — ED Notes (Signed)
Pt states she is on palliative care and denies the desire for intubation or life support. EDP at bedside.

## 2018-06-29 NOTE — H&P (Signed)
[facility] NAME: Alice Clark    MR#:  [id number]  DATE OF BIRTH:  22-Mar-1966  DATE OF ADMISSION:  06/29/2018  PRIMARY CARE PHYSICIAN: Donnie Coffin, MD   REQUESTING/REFERRING PHYSICIAN:   CHIEF COMPLAINT:   Chief Complaint  Patient presents with  . Chest Pain    HISTORY OF PRESENT ILLNESS: Alice Clark  is a 52 y.o. female with a known history of inoperable/incurable ovarian cancer-no longer on chemo/radiation, on home palliative care, presented to the emergency room with chest pain last night, black stools, frequent loose stools, in route to the emergency room patient was given aspirin and nitroglycerin, patient also with complaints with generalized weakness, in the emergency room patient was febrile, tachycardic, tachypneic, hypotensive, white count of 38,000, hemoglobin 7.1, albumin 1.4, total bili 1.8, EKG noted with sinus tachycardia with heart rate of 136, potassium 2.7, sodium 139 4, chloride 97, patient evaluated in the emergency room, husband/daughter at the bedside, patient wants to continue palliative care, patient is considering comfort care management with hospice-requests discussion with her son prior to making this decision.  PAST MEDICAL HISTORY:   Past Medical History:  Diagnosis Date  . Diabetes mellitus without complication ([place])   . Diverticulitis     PAST SURGICAL HISTORY:  Past Surgical History:  Procedure Laterality Date  . ABDOMINAL HYSTERECTOMY    . APPENDECTOMY    . CESAREAN SECTION    . TOTAL ABDOMINAL HYSTERECTOMY      SOCIAL HISTORY:  Social History   Tobacco Use  . Smoking status: Never Smoker  . Smokeless tobacco: Never Used  Substance Use Topics  . Alcohol use: No    Frequency: Never    FAMILY HISTORY:  Family History  Problem Relation Age of Onset  . Breast cancer Neg Hx     DRUG ALLERGIES:  Allergies  Allergen Reactions  . Cefepime Other (See Comments)    REVIEW OF SYSTEMS:    CONSTITUTIONAL: +No fever, fatigue, weakness.  EYES: No blurred or double vision.  EARS, NOSE, AND THROAT: No tinnitus or ear pain.  RESPIRATORY: No cough, shortness of breath, wheezing or hemoptysis.  CARDIOVASCULAR: + chest pain,no orthopnea, edema.  GASTROINTESTINAL: + nausea, abdominal pain, frequent loose stools.  GENITOURINARY: No dysuria, hematuria.  ENDOCRINE: No polyuria, nocturia,  HEMATOLOGY: No anemia, easy bruising or bleeding SKIN: No rash or lesion. MUSCULOSKELETAL: No joint pain or arthritis.   NEUROLOGIC: No tingling, numbness, weakness.  PSYCHIATRY: No anxiety or depression.   MEDICATIONS AT HOME:  Prior to Admission medications   Medication Sig Start Date End Date Taking? Authorizing Provider  dexamethasone (DECADRON) 2 MG tablet Take 2 mg by mouth 2 (two) times daily with a meal. 05/16/18  Yes [provider]  magnesium hydroxide (MILK OF MAGNESIA) 400 MG/5ML suspension Take 30 mLs by mouth daily. 06/12/18 07/12/18 Yes [provider]  metFORMIN (GLUCOPHAGE) 500 MG tablet Take 1 tablet (500 mg total) by mouth 2 (two) times daily with a meal. 08/17/17  Yes Alfred Levins, [place], MD  metoprolol tartrate (LOPRESSOR) 25 MG tablet Take 25 mg by mouth 2 (two) times daily. 06/04/18 06/04/19 Yes [provider]  nystatin (MYCOSTATIN) 100000 UNIT/ML suspension Take 5 mLs by mouth 2 (two) times daily.   Yes [provider]  pantoprazole sodium (PROTONIX) 40 mg/20 mL PACK Take 10 mLs by mouth daily. 06/12/18 07/12/18 Yes [provider]  potassium chloride (KCL) 2 mEq/mL SOLN oral liquid Take 5 mLs by  mouth daily. 06/12/18  Yes [provider]  sucralfate (CARAFATE) 1 GM/10ML suspension Take 10 mLs by mouth every 6 (six) hours. 05/31/18 06/30/18 Yes [provider]      PHYSICAL EXAMINATION:   VITAL SIGNS: Blood pressure 107/66, pulse (!) 136, temperature (!) 100.6 F (38.1 C), temperature source Rectal, resp. rate (!) 28,  height 5\' 4"  (1.626 m), weight 59 kg (130 lb), SpO2 97 %.  GENERAL:  52 y.o.-year-old patient lying in the bed with no acute distress.  Cachectic appearing EYES: Pupils equal, round, reactive to light and accommodation. No scleral icterus. Extraocular muscles intact.  HEENT: Head atraumatic, normocephalic. Oropharynx and nasopharynx clear.  NECK:  Supple, no jugular venous distention. No thyroid enlargement, no tenderness.  LUNGS: Normal breath sounds bilaterally, no wheezing, rales,rhonchi or crepitation. No use of accessory muscles of respiration.  CARDIOVASCULAR: S1, S2 normal. No murmurs, rubs, or gallops.  ABDOMEN: Soft, nontender, nondistended. Bowel sounds present. No organomegaly or mass.  EXTREMITIES: No pedal edema, cyanosis, or clubbing.  Severe muscular wasting NEUROLOGIC: Cranial nerves II through XII are intact. MAES. Gait not checked.  PSYCHIATRIC: The patient is alert and oriented x 3.  SKIN: No obvious rash, lesion, or ulcer.   LABORATORY PANEL:   CBC Recent Labs  Lab 06/29/18 1026  WBC 38.7*  HGB 7.1*  HCT 21.6*  PLT 195  MCV 90.6  MCH 30.0  MCHC 33.1  RDW 18.2*  LYMPHSABS 1.5  MONOABS 1.9*  EOSABS 0.0  BASOSABS 0.0   ------------------------------------------------------------------------------------------------------------------  Chemistries  Recent Labs  Lab 06/29/18 1026  NA 134*  K 2.7*  CL 97*  CO2 25  GLUCOSE 141*  BUN 19  CREATININE 0.48  CALCIUM 6.1*  AST 26  ALT 15  ALKPHOS 251*  BILITOT 1.8*   ------------------------------------------------------------------------------------------------------------------ estimated creatinine clearance is 71.8 mL/min (by C-G formula based on SCr of 0.48 mg/dL). ------------------------------------------------------------------------------------------------------------------ No results for input(s): TSH, T4TOTAL, T3FREE, THYROIDAB in the last 72 hours.  Invalid input(s): FREET3   Coagulation  profile Recent Labs  Lab 06/29/18 1026  INR 1.60   ------------------------------------------------------------------------------------------------------------------- No results for input(s): DDIMER in the last 72 hours. -------------------------------------------------------------------------------------------------------------------  Cardiac Enzymes Recent Labs  Lab 06/29/18 1026  TROPONINI 0.46*   ------------------------------------------------------------------------------------------------------------------ Invalid input(s): POCBNP  ---------------------------------------------------------------------------------------------------------------  Urinalysis    Component Value Date/Time   COLORURINE AMBER (A) 06/29/2018 1026   APPEARANCEUR HAZY (A) 06/29/2018 1026   LABSPEC 1.010 06/29/2018 1026   PHURINE 6.0 06/29/2018 1026   GLUCOSEU NEGATIVE 06/29/2018 1026   HGBUR SMALL (A) 06/29/2018 1026   BILIRUBINUR NEGATIVE 06/29/2018 1026   KETONESUR NEGATIVE 06/29/2018 1026   PROTEINUR 30 (A) 06/29/2018 1026   NITRITE NEGATIVE 06/29/2018 1026   LEUKOCYTESUR NEGATIVE 06/29/2018 1026     RADIOLOGY: Dg Chest Port 1 View  Result Date: 06/29/2018 CLINICAL DATA:  Substernal chest pain since this morning. History of diabetes. Reportedly undergoing chemotherapy and in hospice. History of lytic bone lesion. EXAM: PORTABLE CHEST 1 VIEW COMPARISON:  08/17/2017. FINDINGS: 1046 hours. There is a right IJ Port-A-Cath which extends to the superior cavoatrial junction level. The heart size and mediastinal contours are stable. There are lower lung volumes with increased bibasilar pulmonary opacities, which are radiographically most consistent with atelectasis. There is a small left pleural effusion. There is apparent lytic destruction of the left 5th rib laterally. No other osseous abnormalities are seen. IMPRESSION: Low lung volumes with bibasilar pulmonary opacities radiographically most  consistent with atelectasis. Early infection less likely. Lytic destruction of  the left 5th rib laterally as reported in the patient's progress notes from January, although new from available prior radiographs. Electronically Signed   By: Richardean Sale M.D.   On: 06/29/2018 11:32    EKG: Orders placed or performed during the hospital encounter of 06/29/18  . EKG 12-Lead  . EKG 12-Lead  . EKG 12-Lead  . EKG 12-Lead  . ED EKG  . ED EKG    IMPRESSION AND PLAN: *Acute sepsis secondary to unknown etiology Compounded by immunosuppression from incurable ovarian cancer and severe blood loss anemia continue sepsis protocol, empiric Zosyn/Vanco, follow-up On cultures  *Acute melena with severe symptomatic anemia Protonix drip, gastroenterology to see, type and screen, transfuse 2 units packed red blood cells, CBC daily, transfuse as needed, avoid any further antiplatelet/NSAIDs/anticoagulants  *Chronic incurable ovarian cancer No more chemo/radiation, on palliative care at home, wishes to continue DNR/DNR status Currently considering comfort care measures with hospice-would like to talk to son prior to making this decision  *Acute blood loss anemia Secondary to melena Plan of care as stated above  *Acute on chronic severe hypokalemia Check magnesium level, replete with IV/p.o. potassium, BMP in the morning  *Acute chest pain Unable to tolerate antiplatelet therapy at this time given acute GI bleeding, blood pressure is currently too low to tolerate antihypertensives at this time, morphine as needed, cycle cardiac enzymes   Condition guarded Prognosis terminal DVT prophylaxis with SCDs Palliative care to see  All the records are reviewed and case discussed with ED provider. Management plans discussed with the patient, family and they are in agreement.  CODE STATUS:dnr   TOTAL TIME TAKING CARE OF THIS PATIENT: 45 minutes.    Avel Peace Deejay Koppelman M.D on 06/29/2018   Between 7am to  6pm - Pager - [phone]  After 6pm go to [email or web address] - password [email or web address] [facility]  Office  [phone]  CC: Primary care physician; Donnie Coffin, MD   Note: This dictation was prepared with Dragon dictation along with smaller phrase technology. Any transcriptional errors that result from this process are unintentional.

## 2018-06-29 NOTE — ED Notes (Signed)
Pt is resting, IV fluids changed at this time. Family at bedside.

## 2018-06-29 NOTE — Consult Note (Signed)
Pharmacy Antibiotic Note  Alice Clark is a 52 y.o. female admitted on 06/29/2018 with sepsis.  Pharmacy has been consulted for Zosyn and Vancomycin dosing. Patient received vancomycin 1g IV and Zosyn 3.375 IV x 1 dose in ED.   Plan: Ke: 0.079   T1/2: 9   Vd: 41.3  Start Vancomycin 1000 IV every 12 hours with 6 hour stack dosing.  Goal trough 15-20 mcg/mL. Calculated trough at Css is 18. Trough level ordered prior to 4th dose.   Start Zosyn 3.375 IV EI every 8 hours.    Height: 5\' 4"  (162.6 cm) Weight: 130 lb (59 kg) IBW/kg (Calculated) : 54.7  Temp (24hrs), Avg:100.6 F (38.1 C), Min:100.6 F (38.1 C), Max:100.6 F (38.1 C)  Recent Labs  Lab 06/29/18 1026  WBC 38.7*  CREATININE 0.48  LATICACIDVEN 1.7    Estimated Creatinine Clearance: 71.8 mL/min (by C-G formula based on SCr of 0.48 mg/dL).    Allergies  Allergen Reactions  . Cefepime Other (See Comments)    Antimicrobials this admission: 8/4 Zosyn >>  84 Vancomycin  >>   Microbiology results: 8/4 BCx: pending 8/4 UCx: pending  Thank you for allowing pharmacy to be a part of this patient's care.  Pernell Dupre, PharmD, BCPS Clinical Pharmacist 06/29/2018 1:11 PM

## 2018-06-29 NOTE — ED Notes (Signed)
Report attempted to be called at this time.

## 2018-06-29 NOTE — ED Provider Notes (Addendum)
[facility] Emergency Department Provider Note  ____________________________________________   I have reviewed the triage vital signs and the nursing notes. Where available I have reviewed prior notes and, if possible and indicated, outside hospital notes.    HISTORY  Chief Complaint Chest Pain    HPI Alice Clark is a 52 y.o. female  who unfortunately has a history of ovarian cancer, no longer in chemoradiation, does have an indwelling port.  Patient is under palliative care and hospice.  She is DNR/DNI and she reaffirms this.  Had some chest pain last night, chest pain is gone.  She is also been constipated she states.  History is somewhat limited.  She denies any fever chills or vomiting at this time although she had some nausea on the way in.  She was seen by EMS they are giving her nitro and aspirin.  Patient does have chronic stomach pain for which she takes sulfa crate, she states there is no change in that.  In any event, patient states she just feels weak and unwell.  She states the chest pain was a "discomfort"    Past Medical History:  Diagnosis Date  . Diabetes mellitus without complication ([place])   . Diverticulitis     There are no active problems to display for this patient.   Past Surgical History:  Procedure Laterality Date  . ABDOMINAL HYSTERECTOMY    . APPENDECTOMY    . CESAREAN SECTION    . TOTAL ABDOMINAL HYSTERECTOMY      Prior to Admission medications   Medication Sig Start Date End Date Taking? Authorizing Provider  dexamethasone (DECADRON) 2 MG tablet Take 2 mg by mouth 2 (two) times daily with a meal. 05/16/18  Yes [provider]  magnesium hydroxide (MILK OF MAGNESIA) 400 MG/5ML suspension Take 30 mLs by mouth daily. 06/12/18 07/12/18 Yes [provider]  metFORMIN (GLUCOPHAGE) 500 MG tablet Take 1 tablet (500 mg total) by mouth 2 (two) times daily with a meal. 08/17/17  Yes Alfred Levins, [place], MD  metoprolol  tartrate (LOPRESSOR) 25 MG tablet Take 25 mg by mouth 2 (two) times daily. 06/04/18 06/04/19 Yes [provider]  nystatin (MYCOSTATIN) 100000 UNIT/ML suspension Take 5 mLs by mouth 2 (two) times daily.   Yes [provider]  pantoprazole sodium (PROTONIX) 40 mg/20 mL PACK Take 10 mLs by mouth daily. 06/12/18 07/12/18 Yes [provider]  potassium chloride (KCL) 2 mEq/mL SOLN oral liquid Take 5 mLs by mouth daily. 06/12/18  Yes [provider]  sucralfate (CARAFATE) 1 GM/10ML suspension Take 10 mLs by mouth every 6 (six) hours. 05/31/18 06/30/18 Yes [provider]    Allergies Cefepime  Family History  Problem Relation Age of Onset  . Breast cancer Neg Hx     Social History Social History   Tobacco Use  . Smoking status: Never Smoker  . Smokeless tobacco: Never Used  Substance Use Topics  . Alcohol use: No    Frequency: Never  . Drug use: No    Review of Systems Constitutional: No fever/chills Eyes: No visual changes. ENT: No sore throat. No stiff neck no neck pain Cardiovascular: + chest pain. Respiratory: Denies shortness of breath. Gastrointestinal:   no vomiting.  No diarrhea.  No constipation. Genitourinary: Negative for dysuria. Musculoskeletal: Negative lower extremity swelling Skin: Negative for rash. Neurological: Negative for severe headaches, focal weakness or numbness.   ____________________________________________   PHYSICAL EXAM:  VITAL SIGNS: ED Triage Vitals  Enc Vitals Group  BP 06/29/18 1025 93/79     Pulse Rate 06/29/18 1025 (!) 146     Resp --      Temp 06/29/18 1025 (!) 100.6 F (38.1 C)     Temp Source 06/29/18 1025 Rectal     SpO2 06/29/18 1025 96 %     Weight 06/29/18 1023 130 lb (59 kg)     Height 06/29/18 1023 5\' 4"  (1.626 m)     Head Circumference --      Peak Flow --      Pain Score --      Pain Loc --      Pain Edu? --      Excl. in [place]? --     Constitutional: Alert and oriented.   Patient is acutely ill and chronically ill-appearing, awake and alert however Eyes: Conjunctivae are normal Head: Atraumatic HEENT: No congestion/rhinnorhea. Mucous membranes are dry.  Oropharynx non-erythematous Neck:   Nontender with no meningismus, no masses, no stridor Cardiovascular: Tachycardia, regular rhythm. Grossly normal heart sounds.  Good peripheral circulation. Respiratory: Normal respiratory effort.  No retractions. Lungs CTAB. Abdominal: Soft and minimal epigastric discomfort which reproduces her chest pain. No distention. No guarding no rebound Back:  There is no focal tenderness or step off.  there is no midline tenderness there are no lesions noted. there is no CVA tenderness Musculoskeletal: No lower extremity tenderness, no upper extremity tenderness. No joint effusions, no DVT signs strong distal pulses no edema Neurologic:  Normal speech and language. No gross focal neurologic deficits are appreciated.  Skin:  Skin is warm, dry and intact. No rash noted. Psychiatric: Mood and affect are normal. Speech and behavior are normal.  ____________________________________________   LABS (all labs ordered are listed, but only abnormal results are displayed)  Labs Reviewed  CBC WITH DIFFERENTIAL/PLATELET - Abnormal; Notable for the following components:      Result Value   WBC 38.7 (*)    RBC 2.38 (*)    Hemoglobin 7.1 (*)    HCT 21.6 (*)    RDW 18.2 (*)    All other components within normal limits  PROTIME-INR - Abnormal; Notable for the following components:   Prothrombin Time 18.9 (*)    All other components within normal limits  URINALYSIS, COMPLETE (UACMP) WITH MICROSCOPIC - Abnormal; Notable for the following components:   Color, Urine AMBER (*)    APPearance HAZY (*)    Hgb urine dipstick SMALL (*)    Protein, ur 30 (*)    All other components within normal limits  URINE CULTURE  CULTURE, BLOOD (ROUTINE X 2)  CULTURE, BLOOD (ROUTINE X 2)  LACTIC ACID, PLASMA   TROPONIN I  LACTIC ACID, PLASMA  COMPREHENSIVE METABOLIC PANEL  LIPASE, BLOOD  TYPE AND SCREEN  PREPARE RBC (CROSSMATCH)    Pertinent labs  results that were available during my care of the patient were reviewed by me and considered in my medical decision making (see chart for details). ____________________________________________  EKG  I personally interpreted any EKGs ordered by me or triage Sinus tachycardia rate 136, no acute ST elevation or depression normal axis ____________________________________________  RADIOLOGY  Pertinent labs & imaging results that were available during my care of the patient were reviewed by me and considered in my medical decision making (see chart for details). If possible, patient and/or family made aware of any abnormal findings.  No results found. ____________________________________________    PROCEDURES  Procedure(s) performed: None  Procedures  Critical Care performed: CRITICAL  CARE Performed by: Schuyler Amor   Total critical care time: 55 minutes  Critical care time was exclusive of separately billable procedures and treating other patients.  Critical care was necessary to treat or prevent imminent or life-threatening deterioration.  Critical care was time spent personally by me on the following activities: development of treatment plan with patient and/or surrogate as well as nursing, discussions with consultants, evaluation of patient's response to treatment, examination of patient, obtaining history from patient or surrogate, ordering and performing treatments and interventions, ordering and review of laboratory studies, ordering and review of radiographic studies, pulse oximetry and re-evaluation of patient's condition.   ____________________________________________   INITIAL IMPRESSION / ASSESSMENT AND PLAN / ED COURSE  Pertinent labs & imaging results that were available during my care of the patient were reviewed by  me and considered in my medical decision making (see chart for details).  Ill-appearing, febrile here, guaiac positive black stool noted no clear idea how long this is been going on, hemoglobin 7.1, no recent prior for baseline found, patient does consent to IV antibiotics IV fluid and blood we are giving all of those things we are giving her antipyretics no clear source of infection but given high white count, fever, and presence of port, I am giving her broad-spectrum antibiotics.  Blood cultures and urine and urine culture will be obtained.  Patient's lactic acid is 1.7 this is likely a mixed picture of GI bleed with infection.  No known varices.  We will give her Protonix.  Patient is adamant that she does not want to be intubated or have heroic measures but she does consent to blood with risk benefits alternative blood being explained that she does consent to IV fluids and all of her other measures thus far.  ----------------------------------------- 11:50 AM on 06/29/2018 -----------------------------------------  Patient I had a long discussion, she absolutely would not want surgery, even if we found something on CT requiring surgery at this time therefore I do not see any high utility in ordering a CT scan as it were not likely to be able to respond anyway.  We are keeping her comfortable as is her wish and some of these therapeutic interventions to help with that.  Obviously these discussions are very complicated.  Patient is adamant that she does not want intubation and she would like to have a Sprite I will let her have a Sprite  of course she is admitted to the hospitalist service      ____________________________________________   FINAL CLINICAL IMPRESSION(S) / ED DIAGNOSES  Final diagnoses:  Chest pain  Sepsis, due to unspecified organism [facility])  Gastrointestinal hemorrhage, unspecified gastrointestinal hemorrhage type      This chart was dictated using voice recognition  software.  Despite best efforts to proofread,  errors can occur which can change meaning.     Schuyler Amor, MD 06/29/18 1127    Schuyler Amor, MD 06/29/18 1140    Schuyler Amor, MD 06/29/18 1150

## 2018-06-29 NOTE — ED Notes (Addendum)
Pt repositioned in bed onto left side with pillows for comfort. Family member provided with blankets. Denies any other needs at this time.

## 2018-06-29 NOTE — ED Notes (Addendum)
Rn received call that Type and Screen hemolyzed. RN to draw 2nd attempt using Kurin and pink top tube. Blood cultures sent at this time. Kurin device used for both RN will monitor for results.

## 2018-06-29 NOTE — ED Notes (Signed)
Critcal Lab levels of :  Ca+6.1 K+ 2.7  Trop 0.46  Was reported off to [place].

## 2018-06-30 DIAGNOSIS — L8990 Pressure ulcer of unspecified site, unspecified stage: Secondary | ICD-10-CM

## 2018-06-30 DIAGNOSIS — D62 Acute posthemorrhagic anemia: Secondary | ICD-10-CM

## 2018-06-30 DIAGNOSIS — K921 Melena: Secondary | ICD-10-CM

## 2018-06-30 LAB — URINE CULTURE: Culture: NO GROWTH

## 2018-06-30 LAB — HEMOGLOBIN: HEMOGLOBIN: 8 g/dL — AB (ref 12.0–16.0)

## 2018-06-30 LAB — GLUCOSE, CAPILLARY
GLUCOSE-CAPILLARY: 170 mg/dL — AB (ref 70–99)
GLUCOSE-CAPILLARY: 140 mg/dL — AB (ref 70–99)
GLUCOSE-CAPILLARY: 116 mg/dL — AB (ref 70–99)
Glucose-Capillary: 119 mg/dL — ABNORMAL HIGH (ref 70–99)

## 2018-06-30 LAB — BASIC METABOLIC PANEL
Anion gap: 11 (ref 5–15)
BUN: 18 mg/dL (ref 6–20)
CHLORIDE: 103 mmol/L (ref 98–111)
CO2: 22 mmol/L (ref 22–32)
CREATININE: 0.58 mg/dL (ref 0.44–1.00)
Calcium: 5.8 mg/dL — CL (ref 8.9–10.3)
GFR calc non Af Amer: >60 mL/min (ref >60)
Glucose, Bld: 156 mg/dL — ABNORMAL HIGH (ref 70–99)
POTASSIUM: 3.3 mmol/L — AB (ref 3.5–5.1)
SODIUM: 136 mmol/L (ref 135–145)

## 2018-06-30 LAB — TROPONIN I: TROPONIN I: 0.14 ng/mL — AB (ref <0.03)

## 2018-06-30 LAB — MAGNESIUM: Magnesium: 1.2 mg/dL — ABNORMAL LOW (ref 1.7–2.4)

## 2018-06-30 MED ORDER — POTASSIUM CHLORIDE CRYS ER 20 MEQ PO TBCR: 40.0000 meq | EXTENDED_RELEASE_TABLET | ORAL | Status: DC

## 2018-06-30 MED ORDER — POTASSIUM CHLORIDE IN NACL 40-0.9 MEQ/L-% IV SOLN
INTRAVENOUS | Status: DC
  Administered 2018-06-30 – 2018-07-01 (×2): 100 mL/h via INTRAVENOUS
  Filled 2018-06-30 (×5): qty 1000

## 2018-06-30 MED ORDER — POTASSIUM CHLORIDE 10 MEQ/100ML IV SOLN
10.0000 meq | INTRAVENOUS | Status: AC
  Administered 2018-06-30 (×4): 10 meq via INTRAVENOUS
  Filled 2018-06-30 (×4): qty 100

## 2018-06-30 MED ORDER — MAGNESIUM SULFATE 4 GM/100ML IV SOLN
4.0000 g | Freq: Once | INTRAVENOUS | Status: AC
  Administered 2018-06-30: 12:00:00 4 g via INTRAVENOUS
  Filled 2018-06-30: qty 100

## 2018-06-30 MED ORDER — ALBUMIN HUMAN 25 % IV SOLN
25.0000 g | Freq: Two times a day (BID) | INTRAVENOUS | Status: AC
  Administered 2018-06-30 – 2018-07-01 (×4): 25 g via INTRAVENOUS
  Filled 2018-06-30 (×7): qty 100

## 2018-06-30 NOTE — Progress Notes (Signed)
Pharmacy Electrolyte Monitoring Consult:  Pharmacy consulted to assist in monitoring and replacing electrolytes in this 52 y.o. female admitted on 06/29/2018 with Chest Pain   Labs:  Sodium (mmol/L)  Date Value  06/30/2018 136   Potassium (mmol/L)  Date Value  06/30/2018 3.3 (L)   Magnesium (mg/dL)  Date Value  06/30/2018 1.2 (L)   Calcium (mg/dL)  Date Value  06/30/2018 5.8 (LL)   Albumin (g/dL)  Date Value  06/29/2018 1.4 (L)    Assessment/Plan: K 3.3,  Mag 1.2  Scr 0.58 Patient has Potassium 10 meq IV x 4 bags ordered by MD and IVF of NS w/ KCL 40 meq/L @ 100 ml/hr. Patient not taking oral Potassium that was ordered yesterday. Will order Magnesium 4 gram IV x 1. Will f/u with am labs.   Jeanmarc Viernes A 06/30/2018 10:33 AM

## 2018-06-30 NOTE — Progress Notes (Signed)
Per previous shift nurse, patient stated that in the event her heart stops, she does want everything done for her.  She does not want to be a DNR at this time, contrary to what the dictation says.

## 2018-06-30 NOTE — Progress Notes (Signed)
   06/30/18 1430  Clinical Encounter Type  Visited With Patient and family together  Visit Type Initial;Spiritual support  Referral From Nurse  Consult/Referral To Chaplain  Spiritual Encounters  Spiritual Needs Prayer;Emotional   [place] visited with patient and family while rounding on 1C. Patient was very polite and thankful for my visit. I prayed silently outside of the room and will follow up as needed.

## 2018-06-30 NOTE — Progress Notes (Addendum)
Visit made. Patient is currently followed by [facility] at home with a diagnosis of metastatic ovarian cancer. She is a FULL CODE. Patient was sent to the [facility] ED pm 8/4 for evaluation of chest pain and bloody stool. she was admitted for evaluation. GI and Palliative Medicine were  Consulted. Per chart note review she received blood transfusion yesterday. She is currently receiving albumin, IV fluids, antibiotics, IV Protonix  and electrolyte replacements. Patient seen sitting up in bed, alert and interactive, reports feeling "better". Daughter Tax inspector and other family at bedside. Alice Clark had gone out earlier to get her mother a requested sandwich. Patient reports she was unable to eat it, "it  tasted good but wouldn't go down". Speech Therapy consult in place. Patient continues to want full scope of treatment as she states she has " a lot more work to do". 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 Emotional support given. Will continue to follow and update hospice team Hospital team aware of hospice involvement.  Flo Shanks RN, BSN [facility] [place], hospital Liaison [phone]

## 2018-06-30 NOTE — Care Management (Signed)
Patient is followed by [facility] at home for metastatic ovarian cancer.  At present is a full code.  Admitted for melena requiring transfusion.  GI is following

## 2018-06-30 NOTE — Evaluation (Addendum)
Clinical/Bedside Swallow Evaluation Patient Details  Name: Alice Clark MRN: [id number] Date of Birth: Sep 27, 1966  Today's Date: 06/30/2018 Time: SLP Start Time (ACUTE ONLY): 1235 SLP Stop Time (ACUTE ONLY): 1335 SLP Time Calculation (min) (ACUTE ONLY): 60 min  Past Medical History:  Past Medical History:  Diagnosis Date  . Diabetes mellitus without complication ([place])   . Diverticulitis    Past Surgical History:  Past Surgical History:  Procedure Laterality Date  . ABDOMINAL HYSTERECTOMY    . APPENDECTOMY    . CESAREAN SECTION    . TOTAL ABDOMINAL HYSTERECTOMY     HPI:  Pt is a 52 y.o. female with a known history of inoperable/incurable ovarian cancer-no longer on chemo/radiation, on home palliative care, presented to the emergency room with chest pain last night, black stools, frequent loose stools. Patient is followed by [facility] at home for metastatic ovarian cancer.  At present is a full code.  Admitted for melena requiring transfusion.  GI is following.  Per EGD, GI has noted EGD at [facility] on July 2 due to odynophagia, that revealed grade D esophagitis, severe esophagitis found 19 to 25 cm from the incisors.  Impression was severe chemotherapy/radiation esophagitis.  Pt is on Protonix 40 mg, sucralfate.  Pt c/o difficulty w/ motility issues when eating foods; no difficulty when drinking liquids - also experiences a full feeling w/ any oral intake and sometimes does "not eat all day" in recent days per pt report.    Assessment / Plan / Recommendation Clinical Impression  Pt appears to present w/ adequate oropharyngeal phase swallow function w/ reduced risk for aspiration following general aspiration precautions. Pt does have the recent dx of SEVERE ESOPHAGITIS which can result in Esophageal dysmotility especially w/ solids which is pt's main complaint. No oropharyngeal phase deficits noted w/ the trials of thin liquids pt consumed; she declined foods trials d/t awaiting a  desired food(tomato sandwich) being brought in by family, and she did not want to ruin her appetite. Pt described feelings of Esophageal dysmotility - suspect related to the Severe Esophagitis dx by GI at [facility] recently; pt is on a PPI and being followed here by GI today. Thoroughly discussed pt's Esophageal phase dysmotitlity and its impact on the pharyngeal phase of swallowing as well as the desire for oral intake in general; Reflux precautions; food consistencies/options and ways to modify for easier intake. Recommended f/u w/ Dietician for further education on supportive foods for pt; DRINK SUPPLEMENTS as these may be easier than foods. Recommend continue a regular diet w/ foods of choice/preference; general aspiration precautions; Reflux precautions; taking Pills w/ a Puree for easier clearing overall. Pt/family agreed w/ information/education. ST services will be available if needed while admitted. NSG updated. Dietician consult placed.  SLP Visit Diagnosis: Dysphagia, unspecified (R13.10)(Esophageal dysmotility - severe Esophagitis per MD)    Aspiration Risk  (reduced)    Diet Recommendation  Regular diet w/ foods of choice - moistened well, cut small. Thin liquids. Aspiration precautions; REFLUX precautions  Medication Administration: Whole meds with puree(for easier, safer swallowing)    Other  Recommendations Recommended Consults: (GI following; Hospice services following; Dietician) Oral Care Recommendations: Oral care BID;Patient independent with oral care Other Recommendations: (n/a)   Follow up Recommendations None      Frequency and Duration (n/a)  (n/a)       Prognosis Prognosis for Safe Diet Advancement: Fair Barriers to Reach Goals: (baseline GI issues)      Swallow Study   General  Date of Onset: 06/29/18 HPI: Pt is a 52 y.o. female with a known history of inoperable/incurable ovarian cancer-no longer on chemo/radiation, on home palliative care, presented to the emergency  room with chest pain last night, black stools, frequent loose stools. Patient is followed by [facility] at home for metastatic ovarian cancer.  At present is a full code.  Admitted for melena requiring transfusion.  GI is following.  Per EGD, GI has noted EGD at [facility] on July 2 due to odynophagia, that revealed grade D esophagitis, severe esophagitis found 19 to 25 cm from the incisors.  Impression was severe chemotherapy/radiation esophagitis.  Pt is on Protonix 40 mg, sucralfate.  Pt c/o difficulty w/ motility issues when eating foods; no difficulty when drinking liquids - also experiences a full feeling w/ any oral intake and sometimes does "not eat all day" in recent days per pt report.  Type of Study: Bedside Swallow Evaluation Previous Swallow Assessment: none reported Diet Prior to this Study: Regular;Thin liquids(reduced oral intake overall) Temperature Spikes Noted: (wbc elevated;  temp elevated) Respiratory Status: Room air History of Recent Intubation: No Behavior/Cognition: Alert;Cooperative;Pleasant mood Oral Cavity Assessment: Dry Oral Care Completed by SLP: Recent completion by staff Oral Cavity - Dentition: Adequate natural dentition Vision: Functional for self-feeding Self-Feeding Abilities: Able to feed self;Needs set up(min assist) Patient Positioning: Postural control adequate for testing;Upright in bed(encouraged to sit more upright w/ po intake) Baseline Vocal Quality: Normal;Low vocal intensity(slightly) Volitional Cough: Strong Volitional Swallow: Able to elicit    Oral/Motor/Sensory Function Overall Oral Motor/Sensory Function: Within functional limits   Ice Chips Ice chips: Not tested   Thin Liquid Thin Liquid: Within functional limits Presentation: Self Fed;Straw(3 trials) Other Comments: did not want further d/t waiting on her sandwich from a family member    Nectar Thick Nectar Thick Liquid: Not tested   Honey Thick Honey Thick Liquid: Not tested    Puree Puree: Not tested   Solid     Solid: Not tested Other Comments: declined - waiting on food to be brought in        Orinda Kenner, [place], CCC-SLP Watson,Katherine 06/30/2018,4:36 PM

## 2018-06-30 NOTE — Progress Notes (Signed)
[facility] [place] NAME: Alice Clark    MR#:  [id number]  DATE OF BIRTH:  12-20-1965  SUBJECTIVE:  CHIEF COMPLAINT:   Chief Complaint  Patient presents with  . Chest Pain   -Patient with metastatic ovarian cancer diagnosed in February 2019, last treatment on 05/22/2018.  Has ongoing progressive disease.  Currently treatment on hold.  Admitted for melanotic stools. -Received 2 units packed RBC transfusion yesterday.  REVIEW OF SYSTEMS:  Review of Systems  Constitutional: Positive for malaise/fatigue. Negative for chills and fever.  HENT: Negative for congestion, ear discharge, hearing loss and nosebleeds.   Eyes: Negative for blurred vision and double vision.  Respiratory: Negative for cough, shortness of breath and wheezing.   Cardiovascular: Negative for chest pain and palpitations.  Gastrointestinal: Positive for blood in stool and melena. Negative for abdominal pain, constipation, diarrhea, nausea and vomiting.  Genitourinary: Negative for dysuria and urgency.  Musculoskeletal: Negative for myalgias.  Neurological: Negative for dizziness, focal weakness, seizures, weakness and headaches.  Psychiatric/Behavioral: Negative for depression.    DRUG ALLERGIES:   Allergies  Allergen Reactions  . Cefepime Other (See Comments)    VITALS:  Blood pressure 93/62, pulse (!) 104, temperature 97.9 F (36.6 C), temperature source Oral, resp. rate 20, height 5\' 4"  (1.626 m), weight 59 kg (130 lb), SpO2 99 %.  PHYSICAL EXAMINATION:  Physical Exam  GENERAL:  52 y.o.-year-old frail-appearing patient lying in the bed with no acute distress.  EYES: Pupils equal, round, reactive to light and accommodation. No scleral icterus. Extraocular muscles intact.  HEENT: Head atraumatic, normocephalic. Oropharynx and nasopharynx clear.  NECK:  Supple, no jugular venous distention. No thyroid enlargement, no tenderness.  LUNGS: Normal breath sounds  bilaterally, no wheezing, rales,rhonchi or crepitation. No use of accessory muscles of respiration.  Decreased bibasilar breath sounds. CARDIOVASCULAR: S1, S2 normal. No murmurs, rubs, or gallops.  ABDOMEN: Soft, distended abdomen with diffuse tenderness, no rigidity or guarding.. Bowel sounds present. No organomegaly or mass.  EXTREMITIES: No pedal edema, cyanosis, or clubbing.  NEUROLOGIC: Cranial nerves II through XII are intact. Muscle strength 5/5 in all extremities. Sensation intact. Gait not checked.  Global weakness noted PSYCHIATRIC: The patient is alert and oriented x 3.  SKIN: No obvious rash, lesion, or ulcer.    LABORATORY PANEL:   CBC Recent Labs  Lab 06/29/18 1518 06/29/18 2036  WBC 32.2*  --   HGB 6.3* 7.9*  HCT 19.5* 23.6*  PLT 171  --    ------------------------------------------------------------------------------------------------------------------  Chemistries  Recent Labs  Lab 06/29/18 1518 06/30/18 0457  NA 136 136  K 2.4* 3.3*  CL 101 103  CO2 25 22  GLUCOSE 136* 156*  BUN 17 18  CREATININE 0.51 0.58  CALCIUM 5.7* 5.8*  MG 1.2* 1.2*  AST 24  --   ALT 13  --   ALKPHOS 215*  --   BILITOT 1.8*  --    ------------------------------------------------------------------------------------------------------------------  Cardiac Enzymes Recent Labs  Lab 06/30/18 0457  TROPONINI 0.14*   ------------------------------------------------------------------------------------------------------------------  RADIOLOGY:  Dg Chest Port 1 View  Result Date: 06/29/2018 CLINICAL DATA:  Substernal chest pain since this morning. History of diabetes. Reportedly undergoing chemotherapy and in hospice. History of lytic bone lesion. EXAM: PORTABLE CHEST 1 VIEW COMPARISON:  08/17/2017. FINDINGS: 1046 hours. There is a right IJ Port-A-Cath which extends to the superior cavoatrial junction level. The heart size and mediastinal contours are stable. There are lower lung  volumes with  increased bibasilar pulmonary opacities, which are radiographically most consistent with atelectasis. There is a small left pleural effusion. There is apparent lytic destruction of the left 5th rib laterally. No other osseous abnormalities are seen. IMPRESSION: Low lung volumes with bibasilar pulmonary opacities radiographically most consistent with atelectasis. Early infection less likely. Lytic destruction of the left 5th rib laterally as reported in the patient's progress notes from January, although new from available prior radiographs. Electronically Signed   By: Richardean Sale M.D.   On: 06/29/2018 11:32    EKG:   Orders placed or performed during the hospital encounter of 06/29/18  . EKG 12-Lead  . EKG 12-Lead  . EKG 12-Lead  . EKG 12-Lead  . ED EKG  . ED EKG    ASSESSMENT AND PLAN:   52 year old female with progressive metastatic ovarian cancer currently treatment on hold, chronic pain secondary to bone medicine stasis presents to hospital secondary to melena and GI bleed  1.  Melanotic stools-secondary to upper GI bleed -Upper GI endoscopy at [facility] 1 month ago showing severe grade 4 esophagitis.  And patient has been taking  NSAIDs too.  Could be also related to that. -Continue IV Protonix.  Appreciate GI consult -No plans to do any repeat procedures at this time -Continue to monitor hemoglobin every 8 hours.  Transfuse to keep hemoglobin greater than 7.  Patient has received several transfusions last month at [facility].  2.  Sepsis-not sure if sepsis is present.  Patient has elevated white count. -However her WBC counts at [facility] were also elevated as high as 34.5 on 05/27/2018 and were down to 24.3 at 06/11/2018 -Continue with broad-spectrum antibiotics for now, she is on vancomycin and Zosyn.  Until culture results are back -Patient on Decadron as outpatient.  3.  Severe esophagitis-on Protonix and also sucralfate  4.  Hypokalemia, hypomagnesemia and hypocalcemia-being  replaced appropriately -Corrected calcium within normal limits. -IV albumin has been ordered  5.  Metastatic ovarian cancer-with liver and bone mets. -Follows with [facility] oncology.  Progressive disease in spite of chemotherapy.  Last chemo at the end of June 2019.  Currently on hold. -Patient very hopeful that she could recover and get back with chemotherapy -Probably unrealistic expectations.  We will have palliative follow-up..  Patient is followed by hospice at home.  Will need to family meeting with palliative and patient and family including her son.  6.  DVT prophylaxis-teds and SCDs   Overall poor prognosis.  Physical therapy consult when medically stable   All the records are reviewed and case discussed with Care Management/Social Workerr. Management plans discussed with the patient, family and they are in agreement.  CODE STATUS: Full code  TOTAL TIME TAKING CARE OF THIS PATIENT: 38 minutes.   POSSIBLE D/C IN 2-3 DAYS, DEPENDING ON CLINICAL CONDITION.   Vanetta Rule M.D on 06/30/2018 at 4:13 PM  Between 7am to 6pm - Pager - [phone]  After 6pm go to [email or web address] - password [email or web address] [place]  Office  [phone]  CC: Primary care physician; Donnie Coffin, MD

## 2018-06-30 NOTE — Plan of Care (Signed)
  Problem: Education: Goal: Knowledge of General Education information will improve Description Including pain rating scale, medication(s)/side effects and non-pharmacologic comfort measures Outcome: Progressing   Problem: Health Behavior/Discharge Planning: Goal: Ability to manage health-related needs will improve Outcome: Progressing   Problem: Clinical Measurements: Goal: Ability to maintain clinical measurements within normal limits will improve Outcome: Progressing Goal: Will remain free from infection Outcome: Progressing Goal: Diagnostic test results will improve Outcome: Progressing Goal: Respiratory complications will improve Outcome: Progressing Goal: Cardiovascular complication will be avoided Outcome: Progressing   Problem: Activity: Goal: Risk for activity intolerance will decrease Outcome: Progressing   Problem: Nutrition: Goal: Adequate nutrition will be maintained Outcome: Progressing   Problem: Coping: Goal: Level of anxiety will decrease Outcome: Progressing   Problem: Elimination: Goal: Will not experience complications related to bowel motility Outcome: Progressing Goal: Will not experience complications related to urinary retention Outcome: Progressing   Problem: Pain Managment: Goal: General experience of comfort will improve Outcome: Progressing   Problem: Safety: Goal: Ability to remain free from injury will improve Outcome: Progressing   Problem: Skin Integrity: Goal: Risk for impaired skin integrity will decrease Outcome: Progressing   Problem: Urinary Elimination: Goal: Signs and symptoms of infection will decrease Outcome: Progressing   Problem: Bowel/Gastric: Goal: Will show no signs and symptoms of gastrointestinal bleeding Outcome: Progressing   Problem: Fluid Volume: Goal: Will show no signs and symptoms of excessive bleeding Outcome: Progressing

## 2018-06-30 NOTE — Consult Note (Signed)
Vonda Antigua, MD [address] [place], [place], [place], [address] [id number], [place], [place], [place], [address] Phone: [phone]  Fax: [phone]  Consultation  Referring Provider:     Dr. Tressia Miners Primary Care Physician:  Donnie Coffin, MD Reason for Consultation:     Melena  Date of Admission:  06/29/2018 Date of Consultation:  06/30/2018         HPI:   Alice Clark is a 52 y.o. female with history of metastatic ovarian cancer diagnosed in February 2019, on home hospice, presents with melanotic stool at home.  Reports 1 week history of symptoms, with multiple 4-5 melanotic bowel movements yesterday.  Last bowel movement was at 2 or 3 AM this morning, and none since then.  Denies any abdominal pain, nausea or vomiting.  Tolerating oral diet.  Takes ibuprofen 3-4 times a week. Had an EGD at [facility] on July 2 due to odynophagia, that revealed grade D esophagitis, severe esophagitis found 19 to 25 cm from the incisors.  Impression was severe chemotherapy/radiation esophagitis.  Patient denies any further dysphagia or odynophagia.  Is on Protonix 40 mg daily as an outpatient.  Also on sucralfate.  Troponin elevated on admission at 0.46, and patient had also complained of chest pain on admission, which she states is resolved at this time.  Past Medical History:  Diagnosis Date  . Diabetes mellitus without complication ([place])   . Diverticulitis     Past Surgical History:  Procedure Laterality Date  . ABDOMINAL HYSTERECTOMY    . APPENDECTOMY    . CESAREAN SECTION    . TOTAL ABDOMINAL HYSTERECTOMY      Prior to Admission medications   Medication Sig Start Date End Date Taking? Authorizing Provider  dexamethasone (DECADRON) 2 MG tablet Take 2 mg by mouth 2 (two) times daily with a meal. 05/16/18  Yes [provider]  magnesium hydroxide (MILK OF MAGNESIA) 400 MG/5ML suspension Take 30 mLs by mouth daily. 06/12/18 07/12/18 Yes [provider]    metFORMIN (GLUCOPHAGE) 500 MG tablet Take 1 tablet (500 mg total) by mouth 2 (two) times daily with a meal. 08/17/17  Yes Alfred Levins, [place], MD  metoprolol tartrate (LOPRESSOR) 25 MG tablet Take 25 mg by mouth 2 (two) times daily. 06/04/18 06/04/19 Yes [provider]  nystatin (MYCOSTATIN) 100000 UNIT/ML suspension Take 5 mLs by mouth 2 (two) times daily.   Yes [provider]  pantoprazole sodium (PROTONIX) 40 mg/20 mL PACK Take 10 mLs by mouth daily. 06/12/18 07/12/18 Yes [provider]  potassium chloride (KCL) 2 mEq/mL SOLN oral liquid Take 5 mLs by mouth daily. 06/12/18  Yes [provider]  sucralfate (CARAFATE) 1 GM/10ML suspension Take 10 mLs by mouth every 6 (six) hours. 05/31/18 06/30/18 Yes [provider]    Family History  Problem Relation Age of Onset  . Breast cancer Neg Hx      Social History   Tobacco Use  . Smoking status: Never Smoker  . Smokeless tobacco: Never Used  Substance Use Topics  . Alcohol use: No    Frequency: Never  . Drug use: No    Allergies as of 06/29/2018 - Review Complete 06/29/2018  Allergen Reaction Noted  . Cefepime Other (See Comments) 04/19/2018    Review of Systems:    All systems reviewed and negative except where noted in HPI.   Physical Exam:  Vital signs in last 24 hours: Vitals:   06/29/18 2253 06/30/18 0124 06/30/18  0156 06/30/18 0425  BP: (!) 98/55 106/67 108/69 94/65  Pulse: (!) 128 (!) 130 (!) 127 (!) 113  Resp:  17 17 16   Temp:  99.5 F (37.5 C) 99.1 F (37.3 C) 98.1 F (36.7 C)  TempSrc:  Oral Oral Oral  SpO2:  100% 99% 98%  Weight:      Height:       Last BM Date: 06/29/18 General:   Pleasant, cooperative in NAD Head:  Normocephalic and atraumatic. Eyes:   No icterus.   Conjunctiva pink. PERRLA. Ears:  Normal auditory acuity. Neck:  Supple; no masses or thyroidomegaly Lungs: Respirations even and unlabored. Lungs clear to auscultation bilaterally.   No wheezes, crackles,  or rhonchi.  Abdomen:  Soft, nondistended, nontender. Normal bowel sounds. No appreciable masses or hepatomegaly.  No rebound or guarding.  Neurologic:  Alert and oriented x3;  grossly normal neurologically. Skin:  Intact without significant lesions or rashes. Cervical Nodes:  No significant cervical adenopathy. Psych:  Alert and cooperative. Normal affect.  LAB RESULTS: Recent Labs    06/29/18 1026 06/29/18 1518 06/29/18 2036  WBC 38.7* 32.2*  --   HGB 7.1* 6.3* 7.9*  HCT 21.6* 19.5* 23.6*  PLT 195 171  --    BMET Recent Labs    06/29/18 1026 06/29/18 1518 06/30/18 0457  NA 134* 136 136  K 2.7* 2.4* 3.3*  CL 97* 101 103  CO2 25 25 22   GLUCOSE 141* 136* 156*  BUN 19 17 18   CREATININE 0.48 0.51 0.58  CALCIUM 6.1* 5.7* 5.8*   LFT Recent Labs    06/29/18 1518  PROT 5.9*  ALBUMIN 1.4*  AST 24  ALT 13  ALKPHOS 215*  BILITOT 1.8*   PT/INR Recent Labs    06/29/18 1026 06/29/18 1518  LABPROT 18.9* 19.3*  INR 1.60 1.64    STUDIES: Dg Chest Port 1 View  Result Date: 06/29/2018 CLINICAL DATA:  Substernal chest pain since this morning. History of diabetes. Reportedly undergoing chemotherapy and in hospice. History of lytic bone lesion. EXAM: PORTABLE CHEST 1 VIEW COMPARISON:  08/17/2017. FINDINGS: 1046 hours. There is a right IJ Port-A-Cath which extends to the superior cavoatrial junction level. The heart size and mediastinal contours are stable. There are lower lung volumes with increased bibasilar pulmonary opacities, which are radiographically most consistent with atelectasis. There is a small left pleural effusion. There is apparent lytic destruction of the left 5th rib laterally. No other osseous abnormalities are seen. IMPRESSION: Low lung volumes with bibasilar pulmonary opacities radiographically most consistent with atelectasis. Early infection less likely. Lytic destruction of the left 5th rib laterally as reported in the patient's progress notes from January,  although new from available prior radiographs. Electronically Signed   By: Richardean Sale M.D.   On: 06/29/2018 11:32      Impression / Plan:   Alice Clark is a 52 y.o. y/o female with metastatic ovarian cancer, on home hospice, with ibuprofen use at home, presents with melanotic stool for 1 week and found to have severe radiation/chemotherapy induced esophagitis on EGD done a month ago at [facility] of melena is likely her known severe esophagitis, however, with her ibuprofen use it could also be peptic ulcer disease Patient is at high risk of any procedures at this time, given her acutely elevated white count above 30s, and elevated troponin and chest pain on presentation Patient will need cardiac clearance prior to any procedures PPI IV twice daily  Continue serial CBCs  and transfuse PRN Avoid NSAIDs Maintain 2 large-bore IV lines  Her last bowel movement was a 2 or 3 in the morning, she is hemodynamically stable, her last hemoglobin check showed improvement of hemoglobin to 7.9 compared to 6.3 on presentation.  If melena and signs of active GI bleed continue to improve and resolve, would recommend conservative management with hospice/palliative care patient with high risk of procedural complications at this time Patient also states she does not want any procedures that would cause risks at this time as well  However, if she continues to have active GI bleeding, would recommend CTA or nuclear med scan to evaluate source of bleeding.  At this time, her bleeding seems to have resolved with PPI IV twice daily  Recheck CBC today  Please page GI with any signs of active GI bleeding, or any questions or concerns   Thank you for involving me in the care of this patient.      LOS: 1 day   Virgel Manifold, MD  06/30/2018, 10:56 AM

## 2018-07-01 DIAGNOSIS — E43 Unspecified severe protein-calorie malnutrition: Secondary | ICD-10-CM

## 2018-07-01 DIAGNOSIS — C569 Malignant neoplasm of unspecified ovary: Secondary | ICD-10-CM

## 2018-07-01 DIAGNOSIS — C800 Disseminated malignant neoplasm, unspecified: Secondary | ICD-10-CM

## 2018-07-01 DIAGNOSIS — K5903 Drug induced constipation: Secondary | ICD-10-CM

## 2018-07-01 LAB — TYPE AND SCREEN
ABO/RH(D): O POS
ANTIBODY SCREEN: NEGATIVE
Unit division: 0
Unit division: 0

## 2018-07-01 LAB — CBC
HEMATOCRIT: 24.8 % — AB (ref 35.0–47.0)
HEMOGLOBIN: 8.3 g/dL — AB (ref 12.0–16.0)
MCH: 30.6 pg (ref 26.0–34.0)
MCHC: 33.6 g/dL (ref 32.0–36.0)
MCV: 90.9 fL (ref 80.0–100.0)
Platelets: 160 10*3/uL (ref 150–440)
RBC: 2.73 MIL/uL — ABNORMAL LOW (ref 3.80–5.20)
RDW: 17.1 % — ABNORMAL HIGH (ref 11.5–14.5)
WBC: 35.7 10*3/uL — ABNORMAL HIGH (ref 3.6–11.0)

## 2018-07-01 LAB — BASIC METABOLIC PANEL
ANION GAP: 9 (ref 5–15)
BUN: 14 mg/dL (ref 6–20)
CALCIUM: 5.6 mg/dL — AB (ref 8.9–10.3)
CO2: 20 mmol/L — AB (ref 22–32)
Chloride: 108 mmol/L (ref 98–111)
Creatinine, Ser: 0.71 mg/dL (ref 0.44–1.00)
GFR calc Af Amer: >60 mL/min (ref >60)
GFR calc non Af Amer: >60 mL/min (ref >60)
GLUCOSE: 121 mg/dL — AB (ref 70–99)
Potassium: 4.3 mmol/L (ref 3.5–5.1)
Sodium: 137 mmol/L (ref 135–145)

## 2018-07-01 LAB — GLUCOSE, CAPILLARY
GLUCOSE-CAPILLARY: 104 mg/dL — AB (ref 70–99)
Glucose-Capillary: 113 mg/dL — ABNORMAL HIGH (ref 70–99)
Glucose-Capillary: 134 mg/dL — ABNORMAL HIGH (ref 70–99)
Glucose-Capillary: 130 mg/dL — ABNORMAL HIGH (ref 70–99)

## 2018-07-01 LAB — BPAM RBC
BLOOD PRODUCT EXPIRATION DATE: 201908282359
Blood Product Expiration Date: 201908282359
ISSUE DATE / TIME: 201908050137
ISSUE DATE / TIME: 201908041629
Unit Type and Rh: 5100
Unit Type and Rh: 5100

## 2018-07-01 LAB — VANCOMYCIN, TROUGH: VANCOMYCIN TR: 34 ug/mL — AB (ref 15–20)

## 2018-07-01 LAB — HIV ANTIBODY (ROUTINE TESTING W REFLEX): HIV Screen 4th Generation wRfx: NONREACTIVE

## 2018-07-01 LAB — HEMOGLOBIN
HEMOGLOBIN: 8.7 g/dL — AB (ref 12.0–16.0)
HEMOGLOBIN: 7.9 g/dL — AB (ref 12.0–16.0)

## 2018-07-01 LAB — PROCALCITONIN: Procalcitonin: 3.53 ng/mL

## 2018-07-01 LAB — MAGNESIUM: Magnesium: 1.8 mg/dL (ref 1.7–2.4)

## 2018-07-01 MED ORDER — OCUVITE-LUTEIN PO CAPS
1.0000 | ORAL_CAPSULE | Freq: Every day | ORAL | Status: DC
  Filled 2018-07-01 (×3): qty 1

## 2018-07-01 MED ORDER — CALCIUM CARBONATE ANTACID 500 MG PO CHEW
400.0000 mg | CHEWABLE_TABLET | Freq: Two times a day (BID) | ORAL | Status: AC
  Administered 2018-07-01 – 2018-07-02 (×3): 400 mg via ORAL
  Filled 2018-07-01 (×4): qty 2

## 2018-07-01 MED ORDER — DEXAMETHASONE 4 MG PO TABS
2.0000 mg | ORAL_TABLET | Freq: Two times a day (BID) | ORAL | Status: DC
  Administered 2018-07-01 – 2018-07-04 (×6): 2 mg via ORAL
  Filled 2018-07-01: qty 0.5
  Filled 2018-07-01: qty 1
  Filled 2018-07-01: qty 0.5
  Filled 2018-07-01: qty 1
  Filled 2018-07-01: qty 0.5
  Filled 2018-07-01: qty 1
  Filled 2018-07-01: qty 0.5

## 2018-07-01 MED ORDER — ENSURE ENLIVE PO LIQD
237.0000 mL | Freq: Three times a day (TID) | ORAL | Status: DC
  Administered 2018-07-01: 22:00:00 237 mL via ORAL

## 2018-07-01 MED ORDER — PANTOPRAZOLE SODIUM 40 MG PO TBEC
40.0000 mg | DELAYED_RELEASE_TABLET | Freq: Two times a day (BID) | ORAL | Status: DC
  Administered 2018-07-02 – 2018-07-04 (×5): 40 mg via ORAL
  Filled 2018-07-01 (×5): qty 1

## 2018-07-01 MED ORDER — VANCOMYCIN HCL IN DEXTROSE 1-5 GM/200ML-% IV SOLN: 1000.0000 mg | INTRAVENOUS | Status: DC | PRN

## 2018-07-01 MED ORDER — MINERAL OIL RE ENEM
1.0000 | ENEMA | Freq: Once | RECTAL | Status: AC
  Administered 2018-07-01: 18:00:00 1 via RECTAL

## 2018-07-01 MED ORDER — POLYETHYLENE GLYCOL 3350 17 G PO PACK
17.0000 g | PACK | Freq: Every day | ORAL | Status: DC
  Administered 2018-07-01 – 2018-07-02 (×2): 17 g via ORAL
  Filled 2018-07-01 (×5): qty 1

## 2018-07-01 MED ORDER — MAGNESIUM OXIDE 400 (241.3 MG) MG PO TABS
400.0000 mg | ORAL_TABLET | Freq: Two times a day (BID) | ORAL | Status: AC
  Administered 2018-07-01 – 2018-07-02 (×4): 400 mg via ORAL
  Filled 2018-07-01 (×4): qty 1

## 2018-07-01 MED ORDER — FLEET ENEMA 7-19 GM/118ML RE ENEM: 1.0000 | ENEMA | Freq: Every day | RECTAL | Status: DC | PRN

## 2018-07-01 NOTE — Plan of Care (Signed)
  Problem: Education: Goal: Knowledge of General Education information will improve Description Including pain rating scale, medication(s)/side effects and non-pharmacologic comfort measures Outcome: Progressing   Problem: Health Behavior/Discharge Planning: Goal: Ability to manage health-related needs will improve Outcome: Progressing   Problem: Clinical Measurements: Goal: Will remain free from infection Outcome: Progressing Goal: Diagnostic test results will improve Outcome: Progressing Goal: Respiratory complications will improve Outcome: Progressing Goal: Cardiovascular complication will be avoided Outcome: Progressing   Problem: Safety: Goal: Ability to remain free from injury will improve Outcome: Progressing   Problem: Bowel/Gastric: Goal: Will show no signs and symptoms of gastrointestinal bleeding Outcome: Progressing

## 2018-07-01 NOTE — Consult Note (Signed)
Consultation Note Date: 07/01/2018   Patient Name: Alice Clark  DOB: 22-Jan-1966  MRN: [id number]  Age / Sex: 52 y.o., female  PCP: Donnie Coffin, MD Referring Physician: Demetrios Loll, MD  Reason for Consultation: Establishing goals of care  HPI/Patient Profile: 52 y.o. 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 Melanotic stools thought to be related to severe esophagitis and NSAID use. She received 2 units PRBCs and protonix infusion. No plans for repeat procedures.  Hgb 7.1 on admission, now 8.7. Patient on vancomycin and zosyn due to elevated white count. She was diagnosed with cancer in February 2019 and last received treatment on 05/22/2018 - treatment currently on hold. She is also currently followed by Hospice outpatient. PMT consulted by admitting physician due to poor prognosis.   Clinical Assessment and Goals of Care: I have reviewed medical records including EPIC notes, labs and imaging, received report from RN, assessed the patient and then met at the bedside along with patient, her cousin, mother, and sister,  to discuss diagnosis prognosis, GOC, EOL wishes, disposition and options.  Per chart review and reports from RN, patient's goals are to get strong enough to continue cancer treatment.   I introduced Palliative Medicine as specialized medical care for people living with serious illness. It focuses on providing relief from the symptoms and stress of a serious illness. The goal is to improve quality of life for both the patient and the family.  Patient and family and very familiar with palliative care as they had palliative care consults at [facility] and are currently followed by hospice. I offered continued support during this hospitalization and patient tells me it is not needed. I also offered to call the patient's children to discuss care and address  questions. Patient requested I NOT contact her children.   Patient appears cachetic and debilitated. Family is at the bedside attempting to get patient to eat; however, does not appear to be eating much. Albumin of 1.4 noted. Per chart review, patient has lost 30 pounds since the end of June, 70 pounds since December.  She tells me symptoms are well managed currently. Denies pain.   Questions and concerns were addressed. The family was encouraged to call with questions or concerns.   Primary Decision Maker PATIENT    SUMMARY OF RECOMMENDATIONS   Patient is currently followed by hospice outpatient. Based on chart review and RN report, patient's goals are to continue aggressive medical therapy. She has spoken with palliative care at [facility] before and was not interested in support from palliative care during this hospitalization. PMT will not continue to follow patient per patient's wishes. Please call if we can be of any further assistance.  Code Status/Advance Care Planning:  Full code - not addressed per patient's wishes  Symptom Management:   Per primary - patient reports symptoms well controlled  Additional Recommendations (Limitations, Scope, Preferences):  Full Scope Treatment  Psycho-social/Spiritual:   Desire for further Chaplaincy support:no  Additional Recommendations: Education on Hospice  Prognosis:   Unable to determine - poor prognosis related to metastatic ovarian cancer, treatment on hold, poor functional and nutritional status  Discharge Planning: Home with Hospice      Primary Diagnoses: Present on Admission: . Melena   I have reviewed the medical record, interviewed the patient and family, and examined the patient. The following aspects are pertinent.  Past Medical History:  Diagnosis Date  .  Diabetes mellitus without complication ([place])   . Diverticulitis    Social History   Socioeconomic History  . Marital status: Divorced    Spouse name: Not on  file  . Number of children: Not on file  . Years of education: Not on file  . Highest education level: Not on file  Occupational History  . Not on file  Social Needs  . Financial resource strain: Not on file  . Food insecurity:    Worry: Not on file    Inability: Not on file  . Transportation needs:    Medical: Not on file    Non-medical: Not on file  Tobacco Use  . Smoking status: Never Smoker  . Smokeless tobacco: Never Used  Substance and Sexual Activity  . Alcohol use: No    Frequency: Never  . Drug use: No  . Sexual activity: Not on file  Lifestyle  . Physical activity:    Days per week: Not on file    Minutes per session: Not on file  . Stress: Not on file  Relationships  . Social connections:    Talks on phone: Not on file    Gets together: Not on file    Attends religious service: Not on file    Active member of club or organization: Not on file    Attends meetings of clubs or organizations: Not on file    Relationship status: Not on file  Other Topics Concern  . Not on file  Social History Narrative  . Not on file   Family History  Problem Relation Age of Onset  . Breast cancer Neg Hx    Scheduled Meds: . acetaminophen  650 mg Oral Once  . calcium carbonate  400 mg of elemental calcium Oral BID  . dexamethasone  2 mg Intravenous BID  . diphenhydrAMINE  25 mg Oral Once  . gi cocktail  30 mL Oral Once  . insulin aspart  0-5 Units Subcutaneous QHS  . insulin aspart  0-9 Units Subcutaneous TID WC  . lactobacillus  1 g Oral TID WC  . magnesium hydroxide  30 mL Oral Daily  . magnesium oxide  400 mg Oral BID  . nystatin  5 mL Oral BID  . [START ON 07/03/2018] pantoprazole  40 mg Intravenous Q12H  . polycarbophil  625 mg Oral Daily  . polyethylene glycol  17 g Oral Daily  . potassium chloride  10 mEq Oral Daily  . sucralfate  1 g Oral Q6H   Continuous Infusions: . albumin human 25 g (07/01/18 0000)  . pantoprozole (PROTONIX) infusion 8 mg/hr (07/01/18  0254)  . piperacillin-tazobactam (ZOSYN)  IV Stopped (07/01/18 0936)   PRN Meds:.acetaminophen **OR** acetaminophen, fentaNYL (SUBLIMAZE) injection, HYDROcodone-acetaminophen, ondansetron **OR** ondansetron (ZOFRAN) IV Allergies  Allergen Reactions  . Cefepime Other (See Comments)   Review of Systems  Constitutional: Positive for appetite change and fatigue.  HENT: Positive for trouble swallowing.   Respiratory: Negative for shortness of breath.   Cardiovascular: Negative for chest pain and leg swelling.  Gastrointestinal: Negative for abdominal pain.  Neurological: Positive for weakness.    Physical Exam  Constitutional: She is oriented to person, place, and time. She appears cachectic. She has a sickly appearance. No distress.  HENT:  Head: Normocephalic and atraumatic.  Cardiovascular: Normal rate and regular rhythm.  Pulmonary/Chest: Effort normal and breath sounds normal. No respiratory distress.  Abdominal: Soft.  Neurological: She is alert and oriented to person, place, and time.  Skin: Skin  is warm.  Psychiatric: She is slowed and withdrawn.    Vital Signs: BP 97/65 (BP Location: Right Arm)   Pulse 100   Temp (!) 97.4 F (36.3 C) (Oral)   Resp (!) 23   Ht 5' 4"  (1.626 m)   Wt 59 kg (130 lb)   SpO2 100%   BMI 22.31 kg/m  Pain Scale: 0-10   Pain Score: Asleep   SpO2: SpO2: 100 % O2 Device:SpO2: 100 % O2 Flow Rate: .   IO: Intake/output summary:   Intake/Output Summary (Last 24 hours) at 07/01/2018 1139 Last data filed at 07/01/2018 0300 Gross per 24 hour  Intake 4509.8 ml  Output -  Net 4509.8 ml    LBM: Last BM Date: 06/30/18 Baseline Weight: Weight: 59 kg (130 lb) Most recent weight: Weight: 59 kg (130 lb)     Palliative Assessment/Data: PPS 40%    Time Total: 30 minutes Greater than 50%  of this time was spent counseling and coordinating care related to the above assessment and plan.  Juel Burrow, DNP, AGNP-C Palliative Medicine  Team [phone] Pager: [phone]

## 2018-07-01 NOTE — Progress Notes (Signed)
Patient had mineral oil enema with good result. Patient stated felling better. Will continue to monitor patient.

## 2018-07-01 NOTE — Progress Notes (Signed)
Pharmacy Electrolyte Monitoring Consult:  Pharmacy consulted to assist in monitoring and replacing electrolytes in this 52 y.o. female admitted on 06/29/2018 with Chest Pain   Labs:  Sodium (mmol/L)  Date Value  07/01/2018 137   Potassium (mmol/L)  Date Value  07/01/2018 4.3   Magnesium (mg/dL)  Date Value  07/01/2018 1.8   Calcium (mg/dL)  Date Value  07/01/2018 5.6 (LL)   Albumin (g/dL)  Date Value  06/29/2018 1.4 (L)    Assessment/Plan: 8/6 AM: K: 4.3, Mg: 1.8, Corrected Ca:7.84 Will order MagOx 400mg  BID x 4 doses and Calcium Carb 400mg  BID x 4 doses.  Will recheck electrolytes with AM labs and continue to replace as needed.   Pernell Dupre, PharmD, BCPS Clinical Pharmacist 07/01/2018 7:15 AM

## 2018-07-01 NOTE — Consult Note (Signed)
Pharmacy Antibiotic Note  Alice Clark is a 52 y.o. female admitted on 06/29/2018 with sepsis.  Pharmacy has been consulted for Zosyn and Vancomycin dosing. Patient received vancomycin 1g IV and Zosyn 3.375 IV x 1 dose in ED.   Plan: Ke: 0.079   T1/2: 9   Vd: 41.3  Start Vancomycin 1000 IV every 12 hours with 6 hour stack dosing.  Goal trough 15-20 mcg/mL. Calculated trough at Css is 18. Trough level ordered prior to 4th dose.   0806 AM vanc level 34. Order changed to PRN. Random level with tomorrow AM labs.   Start Zosyn 3.375 IV EI every 8 hours.    Height: 5\' 4"  (162.6 cm) Weight: 130 lb (59 kg) IBW/kg (Calculated) : 54.7  Temp (24hrs), Avg:97.7 F (36.5 C), Min:97.4 F (36.3 C), Max:97.9 F (36.6 C)  Recent Labs  Lab 06/29/18 1026 06/29/18 1518 06/30/18 0457 07/01/18 0543  WBC 38.7* 32.2*  --  35.7*  CREATININE 0.48 0.51 0.58 0.71  LATICACIDVEN 1.7 1.2  --   --   VANCOTROUGH  --   --   --  34*    Estimated Creatinine Clearance: 71.8 mL/min (by C-G formula based on SCr of 0.71 mg/dL).    Allergies  Allergen Reactions  . Cefepime Other (See Comments)    Antimicrobials this admission: 8/4 Zosyn >>  84 Vancomycin  >>   Microbiology results: 8/4 BCx: pending 8/4 UCx: pending  Thank you for allowing pharmacy to be a part of this patient's care.  Eloise Harman, PharmD, BCPS Clinical Pharmacist 07/01/2018 6:29 AM

## 2018-07-01 NOTE — Progress Notes (Addendum)
[place] [place] NAME: Alice Clark    MR#:  [id number]  DATE OF BIRTH:  [birth year]  SUBJECTIVE:  CHIEF COMPLAINT:   Chief Complaint  Patient presents with  . Chest Pain   -Patient with metastatic ovarian cancer diagnosed in February 2019, last treatment on 05/22/2018.  Has ongoing progressive disease.  Currently treatment on hold.  Admitted for melanotic stools. -Received 2 units packed RBC transfusion. She complains of constipation and strain with bowel movements. REVIEW OF SYSTEMS:  Review of Systems  Constitutional: Positive for malaise/fatigue. Negative for chills and fever.  HENT: Negative for congestion, ear discharge, hearing loss and nosebleeds.   Eyes: Negative for blurred vision and double vision.  Respiratory: Negative for cough, shortness of breath and wheezing.   Cardiovascular: Negative for chest pain and palpitations.  Gastrointestinal: Positive for constipation and melena. Negative for abdominal pain, blood in stool, diarrhea, nausea and vomiting.  Genitourinary: Negative for dysuria and urgency.  Musculoskeletal: Negative for myalgias.  Neurological: Negative for dizziness, focal weakness, seizures, weakness and headaches.  Psychiatric/Behavioral: Negative for depression.    DRUG ALLERGIES:   Allergies  Allergen Reactions  . Cefepime Other (See Comments)    VITALS:  Blood pressure 103/73, pulse (!) 109, temperature 98.4 F (36.9 C), temperature source Oral, resp. rate 20, height 5\' 4"  (1.626 m), weight 130 lb (59 kg), SpO2 97 %.  PHYSICAL EXAMINATION:  Physical Exam  GENERAL:  52 y.o.-year-old frail-appearing patient lying in the bed with no acute distress.  EYES: Pupils equal, round, reactive to light and accommodation. No scleral icterus. Extraocular muscles intact.  HEENT: Head atraumatic, normocephalic. Oropharynx and nasopharynx clear.  NECK:  Supple, no jugular venous distention. No thyroid  enlargement, no tenderness.  LUNGS: Normal breath sounds bilaterally, no wheezing, rales,rhonchi or crepitation. No use of accessory muscles of respiration.  Decreased bibasilar breath sounds. CARDIOVASCULAR: S1, S2 normal. No murmurs, rubs, or gallops.  ABDOMEN: Soft, distended abdomen with diffuse tenderness, no rigidity or guarding.. Bowel sounds present. No organomegaly or mass.  EXTREMITIES: No pedal edema, cyanosis, or clubbing.  NEUROLOGIC: Cranial nerves II through XII are intact. Muscle strength 5/5 in all extremities. Sensation intact. Gait not checked.  Global weakness noted PSYCHIATRIC: The patient is alert and oriented x 3.  SKIN: No obvious rash, lesion, or ulcer.    LABORATORY PANEL:   CBC Recent Labs  Lab 07/01/18 0543 07/01/18 0958  WBC 35.7*  --   HGB 8.3* 8.7*  HCT 24.8*  --   PLT 160  --    ------------------------------------------------------------------------------------------------------------------  Chemistries  Recent Labs  Lab 06/29/18 1518  07/01/18 0543  NA 136   < > 137  K 2.4*   < > 4.3  CL 101   < > 108  CO2 25   < > 20*  GLUCOSE 136*   < > 121*  BUN 17   < > 14  CREATININE 0.51   < > 0.71  CALCIUM 5.7*   < > 5.6*  MG 1.2*   < > 1.8  AST 24  --   --   ALT 13  --   --   ALKPHOS 215*  --   --   BILITOT 1.8*  --   --    < > = values in this interval not displayed.   ------------------------------------------------------------------------------------------------------------------  Cardiac Enzymes Recent Labs  Lab 06/30/18 0457  TROPONINI 0.14*   ------------------------------------------------------------------------------------------------------------------  RADIOLOGY:  No results found.  EKG:   Orders placed or performed during the hospital encounter of 06/29/18  . EKG 12-Lead  . EKG 12-Lead  . EKG 12-Lead  . EKG 12-Lead  . ED EKG  . ED EKG    ASSESSMENT AND PLAN:   52 year old female with progressive metastatic  ovarian cancer currently treatment on hold, chronic pain secondary to bone medicine stasis presents to hospital secondary to melena and GI bleed  1.  Melanotic stools-secondary to upper GI bleed -Upper GI endoscopy at [facility] 1 month ago showing severe grade 4 esophagitis.  And patient has been taking  NSAIDs too.  Could be also related to that. -Continue Protonix bid.  Appreciate GI consult -No plans to do any repeat procedures at this time -Continue to monitor hemoglobin. Transfuse to keep hemoglobin greater than 7.  Patient received several transfusions last month at [facility]. Hemoglobin is stable at 8.7.  2.  Sepsis-not sure if sepsis is present.  Patient has elevated white count. -However her WBC counts at [facility] were also elevated as high as 34.5 on 05/27/2018 and were down to 24.3 at 06/11/2018 -Continue with broad-spectrum antibiotics for now, she is on vancomycin and Zosyn.  Until culture results are back, blood culture is negative so far.  Procalcitonin is 3.53. -Patient on Decadron as outpatient.  3.  Severe esophagitis-on Protonix and also sucralfate  4.  Hypokalemia, hypomagnesemia and hypocalcemia-being replaced appropriately.  Improved. -Corrected calcium within normal limits. -IV albumin was given.  5.  Metastatic ovarian cancer-with liver and bone mets. -Follows with [facility] oncology.  Progressive disease in spite of chemotherapy.  Last chemo at the end of June 2019.  Currently on hold. -Patient very hopeful that she could recover and get back with chemotherapy -Probably unrealistic expectations. Patient is followed by hospice at home.   Per palliative care team, she has spoken with palliative care at [facility] before and was not interested in support from palliative care during this hospitalization.   6.  DVT prophylaxis-teds and SCDs  Constipation.  Start MiraLAX daily per Dr. Bonna Gains.  Overall poor prognosis.  Physical therapy consult when medically stable  I discussed with Dr.  Bonna Gains. All the records are reviewed and case discussed with Care Management/Social Workerr. Management plans discussed with the patient, her mother and they are in agreement.  CODE STATUS: Full code  TOTAL TIME TAKING CARE OF THIS PATIENT: 38 minutes.   POSSIBLE D/C IN 2 DAYS, DEPENDING ON CLINICAL CONDITION.   Demetrios Loll M.D on 07/01/2018 at 4:02 PM  Between 7am to 6pm - Pager - [phone]  After 6pm go to [email or web address] - password [email or web address] [facility]  Office  [phone]  CC: Primary care physician; Donnie Coffin, MD

## 2018-07-01 NOTE — Progress Notes (Signed)
Visit made. Patient seen sitting up in bed alert, family at bedside. Patient reports having difficulty swallowing her medications, discussed with staff RN Reatha Armour. Patient able to take in some ice cream. She requests miralax to be given in her water as she feels that she cannot tolerate it in orange juice. Latre aware. She continues with very little oral intake, discussed the purpose of carafate and swish and swallow. She voiced understanding. IV antibiotics and IV Protonix continue. Hemoglobin 8.7 this morning. Family very supportive. No discharge planned at this time. Will continue to follow and update hospice team. Flo Shanks RN, BSN, [facility], hospital Liaison [phone]

## 2018-07-01 NOTE — Progress Notes (Signed)
Alice Antigua, MD [address] [place], [place], [place], [address] [address], [place], [place], [place], [address] Phone: [phone]  Fax: [phone]   Subjective: No further melena yesterday or today.  Reports having to strain with bowel movements and would like something for that.  No nausea or vomiting.   Objective: Exam: Vital signs in last 24 hours: Vitals:   06/30/18 0425 06/30/18 1452 06/30/18 2009 07/01/18 0530  BP: 94/65 93/62 101/71 97/65  Pulse: (!) 113 (!) 104 (!) 101 100  Resp: 16 20 (!) 23   Temp: 98.1 F (36.7 C) 97.9 F (36.6 C) 97.9 F (36.6 C) (!) 97.4 F (36.3 C)  TempSrc: Oral Oral Oral Oral  SpO2: 98% 99% 100% 100%  Weight:      Height:       Weight change:   Intake/Output Summary (Last 24 hours) at 07/01/2018 1354 Last data filed at 07/01/2018 0300 Gross per 24 hour  Intake 4509.8 ml  Output -  Net 4509.8 ml    General: No acute distress, AAO x3 Abd: Soft, NT/ND, No HSM Skin: Warm, no rashes Neck: Supple, Trachea midline   Lab Results: Lab Results  Component Value Date   WBC 35.7 (H) 07/01/2018   HGB 8.7 (L) 07/01/2018   HCT 24.8 (L) 07/01/2018   MCV 90.9 07/01/2018   PLT 160 07/01/2018   Micro Results: Recent Results (from the past 240 hour(s))  Urine culture     Status: None   Collection Time: 06/29/18 10:26 AM  Result Value Ref Range Status   Specimen Description   Final    URINE, RANDOM Performed at [facility], [address], [place], [place] [address]    Special Requests   Final    NONE Performed at [facility], [address] [place], [place] [address]    Culture   Final    NO GROWTH Performed at [facility], [place] [address], [place], [place] [address]    Report Status 06/30/2018 FINAL  Final  Culture, blood (routine x 2)     Status: None (Preliminary result)   Collection Time: 06/29/18 10:59 AM  Result Value Ref Range Status   Specimen Description BLOOD PORTA CATH   Final   Special Requests   Final    BOTTLES DRAWN AEROBIC AND ANAEROBIC Blood Culture results may not be optimal due to an excessive volume of blood received in culture bottles   Culture   Final    NO GROWTH 2 DAYS Performed at [facility], [address], [place], [place] [address]    Report Status PENDING  Incomplete  Culture, blood (x 2)     Status: None (Preliminary result)   Collection Time: 06/29/18  3:28 PM  Result Value Ref Range Status   Specimen Description BLOOD BLOOD RIGHT HAND  Final   Special Requests   Final    BOTTLES DRAWN AEROBIC AND ANAEROBIC Blood Culture adequate volume   Culture   Final    NO GROWTH 2 DAYS Performed at [facility], [address], [place], [place] [address]    Report Status PENDING  Incomplete  Culture, blood (x 2)     Status: None (Preliminary result)   Collection Time: 06/29/18  3:38 PM  Result Value Ref Range Status   Specimen Description BLOOD BLOOD LEFT HAND  Final   Special Requests   Final    BOTTLES DRAWN AEROBIC AND ANAEROBIC Blood Culture adequate volume  Culture   Final    NO GROWTH 2 DAYS Performed at [facility], [address] [place], [place] [address]    Report Status PENDING  Incomplete   Studies/Results: No results found. Medications:  Scheduled Meds: . acetaminophen  650 mg Oral Once  . calcium carbonate  400 mg of elemental calcium Oral BID  . dexamethasone  2 mg Intravenous BID  . diphenhydrAMINE  25 mg Oral Once  . gi cocktail  30 mL Oral Once  . insulin aspart  0-5 Units Subcutaneous QHS  . insulin aspart  0-9 Units Subcutaneous TID WC  . lactobacillus  1 g Oral TID WC  . magnesium hydroxide  30 mL Oral Daily  . magnesium oxide  400 mg Oral BID  . [START ON 07/02/2018] multivitamin-lutein  1 capsule Oral Daily  . nystatin  5 mL Oral BID  . [START ON 07/03/2018] pantoprazole  40 mg Intravenous Q12H  . polycarbophil  625 mg Oral Daily  . polyethylene glycol  17 g Oral Daily  .  potassium chloride  10 mEq Oral Daily  . sucralfate  1 g Oral Q6H   Continuous Infusions: . pantoprozole (PROTONIX) infusion 8 mg/hr (07/01/18 0254)  . piperacillin-tazobactam (ZOSYN)  IV Stopped (07/01/18 0936)   PRN Meds:.acetaminophen **OR** acetaminophen, fentaNYL (SUBLIMAZE) injection, HYDROcodone-acetaminophen, ondansetron **OR** ondansetron (ZOFRAN) IV   Assessment: Active Problems:   Melena   Pressure injury of skin   Primary malignant neoplasm of ovary with widespread metastatic disease ([place])    Plan: Melena resolved at this time Hemoglobin is stable Continue PPI twice daily Start MiraLAX daily for constipation, and is not having soft bowel movements every day or every other day, increase her MiraLAX twice daily Minimize narcotics if possible Acid reflux lifestyle modifications due to known history of esophagitis Keep head of bed elevated to 30 degrees at all times Continue serial CBCs and transfuse PRN    LOS: 2 days   Alice Antigua, MD 07/01/2018, 1:54 PM

## 2018-07-01 NOTE — Progress Notes (Signed)
Initial Nutrition Assessment  DOCUMENTATION CODES:   Severe malnutrition in context of chronic illness  INTERVENTION:  Recommend liberalizing diet to regular.  Provide Ensure Enlive po TID, each supplement provides 350 kcal and 20 grams of protein.  Reinforced importance of taking in small, frequent meals in setting of anorexia and early satiety. Discussed choosing calorie- and protein-dense foods at meals.  Provide Ocuvite daily to promote wound healing.  NUTRITION DIAGNOSIS:   Severe Malnutrition related to chronic illness(stage IVb ovarian cancer) as evidenced by moderate fat depletion, severe fat depletion, moderate muscle depletion, severe muscle depletion, 31.2% weight loss over 6.5 months.  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   Met with patient, her mother, and her cousin at bedside. Patient reports she has had a very poor appetite since around February when she started treatment. Reports anorexia and early satiety. She tries to eat 6 small meals per day but has difficulty with meeting that sometimes. She reports some days she does not end up eating anything at all. Other days she may have some yogurt or other soft foods. She drinks Premier Protein at home. Discussed that although Premier is high in protein, it is low in calories and patient has high calorie and protein needs. Patient amenable to drinking Ensure Enlive here. Patient reports she is hopeful she can regain her strength and resume treatment.   Patient reports  her UBW was over 200 lbs. Per Care Everywhere she was 191.6 lbs on 12/17/2017, 176.5 lbs on 02/04/2018, 169.7 lbs on 03/18/2018, 131.9 lbs on 06/18/2018. Patient has lost 59.7 lbs (31.2% body weight) over the past 6.5 months, which is significant for time frame.  Medications reviewed and include: calcium carbonate 400 mg BID, Decadron 2 mg BID IV, Novolog 0-9 units TID, Novolog 0-5 units QHS, lactobacillus, milk of magnesia 30 mL daily, magnesium oxide 400 mg BID, nystatin 5 mL BID, pantoprazole, Fibercon, Miralax 17 grams, potassium chloride 10 mEq daily, Carafate 1 gram Q6hrs, Zosyn.  Labs reviewed: CBG 104-140, CO2 20. On 8/4 Calcium 5.7 (corrects to 7.78 with Albumin of 1.4).  NUTRITION - FOCUSED PHYSICAL EXAM:    Most Recent Value  Orbital Region  Severe depletion  Upper Arm Region  Moderate depletion  Thoracic and Lumbar Region  Moderate depletion  Buccal Region  Severe depletion  Temple Region  Severe depletion  Clavicle Bone Region  Severe depletion  Clavicle and Acromion Bone Region  Severe depletion  Scapular Bone Region  Unable to assess  Dorsal Hand  Moderate depletion  Patellar Region  Severe depletion  Anterior Thigh Region  Severe depletion  Posterior Calf Region  Severe depletion  Edema (RD Assessment)  None  Hair  Reviewed  Eyes  Reviewed  Mouth  Reviewed  Skin  Reviewed  Nails  Reviewed     Diet Order:   Diet Order           Diet heart healthy/carb modified Room service appropriate? Yes; Fluid consistency: Thin  Diet effective now  EDUCATION NEEDS:   Education needs have been addressed  Skin:  Skin Assessment: Skin Integrity Issues: Skin Integrity Issues:: Unstageable Unstageable: right buttocks  Last BM:  07/01/2018 - small type 6  Height:   Ht Readings from Last 1 Encounters:  06/29/18 5' 4"  (1.626 m)    Weight:   Wt Readings from Last 1 Encounters:  06/29/18 130 lb (59 kg)    Ideal Body Weight:  54.5 kg  BMI:  Body mass index is  22.31 kg/m.  Estimated Nutritional Needs:   Kcal:  0681-6619 (30-35 kcal/kg)  Protein:  90-100 grams (1.5-1.7 grams/kg)  Fluid:  1.8-2 L/day (1 mL/kcal)  Willey Blade, MS, RD, LDN Office: [phone] Pager: [phone] After Hours/Weekend Pager: [phone]

## 2018-07-01 NOTE — Progress Notes (Signed)
Patient requested enema, MD notified. Will continue to monitor patient.

## 2018-07-02 ENCOUNTER — Inpatient Hospital Stay

## 2018-07-02 DIAGNOSIS — R0600 Dyspnea, unspecified: Secondary | ICD-10-CM

## 2018-07-02 LAB — GLUCOSE, CAPILLARY
GLUCOSE-CAPILLARY: 156 mg/dL — AB (ref 70–99)
GLUCOSE-CAPILLARY: 145 mg/dL — AB (ref 70–99)
Glucose-Capillary: 158 mg/dL — ABNORMAL HIGH (ref 70–99)
Glucose-Capillary: 151 mg/dL — ABNORMAL HIGH (ref 70–99)
Glucose-Capillary: 146 mg/dL — ABNORMAL HIGH (ref 70–99)

## 2018-07-02 LAB — COMPREHENSIVE METABOLIC PANEL
ALT: 11 U/L (ref 0–44)
AST: 21 U/L (ref 15–41)
Albumin: 2 g/dL — ABNORMAL LOW (ref 3.5–5.0)
Alkaline Phosphatase: 200 U/L — ABNORMAL HIGH (ref 38–126)
Anion gap: 9 (ref 5–15)
BUN: 10 mg/dL (ref 6–20)
CHLORIDE: 109 mmol/L (ref 98–111)
CO2: 19 mmol/L — ABNORMAL LOW (ref 22–32)
Calcium: 5.4 mg/dL — CL (ref 8.9–10.3)
Creatinine, Ser: 0.49 mg/dL (ref 0.44–1.00)
GFR calc Af Amer: >60 mL/min (ref >60)
GFR calc non Af Amer: >60 mL/min (ref >60)
GLUCOSE: 155 mg/dL — AB (ref 70–99)
POTASSIUM: 4.5 mmol/L (ref 3.5–5.1)
Sodium: 137 mmol/L (ref 135–145)
Total Bilirubin: 1.9 mg/dL — ABNORMAL HIGH (ref 0.3–1.2)
Total Protein: 5.8 g/dL — ABNORMAL LOW (ref 6.5–8.1)

## 2018-07-02 LAB — CBC
HEMATOCRIT: 24.6 % — AB (ref 35.0–47.0)
Hemoglobin: 8.3 g/dL — ABNORMAL LOW (ref 12.0–16.0)
MCH: 30.9 pg (ref 26.0–34.0)
MCHC: 33.9 g/dL (ref 32.0–36.0)
MCV: 91.1 fL (ref 80.0–100.0)
PLATELETS: 156 10*3/uL (ref 150–440)
RBC: 2.7 MIL/uL — AB (ref 3.80–5.20)
RDW: 17.7 % — AB (ref 11.5–14.5)
WBC: 33.5 10*3/uL — ABNORMAL HIGH (ref 3.6–11.0)

## 2018-07-02 LAB — PHOSPHORUS: PHOSPHORUS: 1.2 mg/dL — AB (ref 2.5–4.6)

## 2018-07-02 LAB — LACTIC ACID, PLASMA: Lactic Acid, Venous: 1 mmol/L (ref 0.5–1.9)

## 2018-07-02 LAB — TROPONIN I
TROPONIN I: 0.09 ng/mL — AB (ref <0.03)
Troponin I: 0.05 ng/mL (ref <0.03)

## 2018-07-02 LAB — VANCOMYCIN, RANDOM: VANCOMYCIN RM: 14

## 2018-07-02 MED ORDER — MORPHINE SULFATE (PF) 2 MG/ML IV SOLN
2.0000 mg | Freq: Once | INTRAVENOUS | Status: AC
  Administered 2018-07-03: 2 mg via INTRAVENOUS
  Filled 2018-07-02: qty 1

## 2018-07-02 MED ORDER — VANCOMYCIN HCL IN DEXTROSE 1-5 GM/200ML-% IV SOLN
1000.0000 mg | INTRAVENOUS | Status: DC
  Administered 2018-07-02 – 2018-07-04 (×3): 1000 mg via INTRAVENOUS
  Filled 2018-07-02 (×4): qty 200

## 2018-07-02 MED ORDER — NITROGLYCERIN 0.4 MG SL SUBL
0.4000 mg | SUBLINGUAL_TABLET | SUBLINGUAL | Status: DC | PRN
  Administered 2018-07-02 (×3): 0.4 mg via SUBLINGUAL
  Filled 2018-07-02 (×3): qty 1

## 2018-07-02 MED ORDER — POTASSIUM PHOSPHATES 15 MMOLE/5ML IV SOLN: 30.0000 mmol | Freq: Once | INTRAVENOUS | Status: DC

## 2018-07-02 MED ORDER — KETOROLAC TROMETHAMINE 30 MG/ML IJ SOLN
30.0000 mg | Freq: Three times a day (TID) | INTRAMUSCULAR | Status: DC | PRN
  Administered 2018-07-03: 30 mg via INTRAVENOUS
  Filled 2018-07-02: qty 1

## 2018-07-02 MED ORDER — IPRATROPIUM-ALBUTEROL 0.5-2.5 (3) MG/3ML IN SOLN
3.0000 mL | Freq: Once | RESPIRATORY_TRACT | Status: AC
  Administered 2018-07-02: 3 mL via RESPIRATORY_TRACT
  Filled 2018-07-02: qty 3

## 2018-07-02 MED ORDER — SODIUM CHLORIDE 0.9 % IV SOLN
INTRAVENOUS | Status: AC
  Administered 2018-07-02: 23:00:00 via INTRAVENOUS

## 2018-07-02 MED ORDER — SODIUM PHOSPHATES 45 MMOLE/15ML IV SOLN
30.0000 mmol | Freq: Once | INTRAVENOUS | Status: AC
  Administered 2018-07-02: 30 mmol via INTRAVENOUS
  Filled 2018-07-02: qty 10

## 2018-07-02 NOTE — Progress Notes (Addendum)
Pharmacy Electrolyte Monitoring Consult:  Pharmacy consulted to assist in monitoring and replacing electrolytes in this 52 y.o. female admitted on 06/29/2018 with Chest Pain   Labs:  Sodium (mmol/L)  Date Value  07/02/2018 137   Potassium (mmol/L)  Date Value  07/02/2018 4.5   Magnesium (mg/dL)  Date Value  07/01/2018 1.8   Phosphorus (mg/dL)  Date Value  07/02/2018 1.2 (L)   Calcium (mg/dL)  Date Value  07/02/2018 5.4 (LL)   Albumin (g/dL)  Date Value  07/02/2018 2.0 (L)    Assessment/Plan: 8/6 AM: Phos:1.2  K:4.5, Corrected Ca:7.2 Sodium Phos 85mmol ordered x 1 dose.  Will continue with  MagOx 400mg  BID x 4 doses and Calcium Carb 400mg  BID x 4 doses. Recheck Phos level at 1800.  Will recheck all electrolytes with AM labs and continue to replace as needed.   Pernell Dupre, PharmD, BCPS Clinical Pharmacist 07/02/2018 7:04 AM

## 2018-07-02 NOTE — Plan of Care (Signed)
  Problem: Education: Goal: Knowledge of General Education information will improve Description Including pain rating scale, medication(s)/side effects and non-pharmacologic comfort measures Outcome: Progressing   Problem: Health Behavior/Discharge Planning: Goal: Ability to manage health-related needs will improve Outcome: Progressing   Problem: Clinical Measurements: Goal: Ability to maintain clinical measurements within normal limits will improve Outcome: Progressing Goal: Will remain free from infection Outcome: Progressing Goal: Diagnostic test results will improve Outcome: Progressing Goal: Respiratory complications will improve Outcome: Progressing Goal: Cardiovascular complication will be avoided Outcome: Progressing   Problem: Activity: Goal: Risk for activity intolerance will decrease Outcome: Not Progressing   Problem: Nutrition: Goal: Adequate nutrition will be maintained Outcome: Not Progressing   Problem: Coping: Goal: Level of anxiety will decrease Outcome: Progressing   Problem: Elimination: Goal: Will not experience complications related to bowel motility Outcome: Progressing Goal: Will not experience complications related to urinary retention Outcome: Progressing   Problem: Pain Managment: Goal: General experience of comfort will improve Outcome: Progressing   Problem: Safety: Goal: Ability to remain free from injury will improve Outcome: Progressing   Problem: Skin Integrity: Goal: Risk for impaired skin integrity will decrease Outcome: Progressing   Problem: Urinary Elimination: Goal: Signs and symptoms of infection will decrease Outcome: Progressing   Problem: Bowel/Gastric: Goal: Will show no signs and symptoms of gastrointestinal bleeding Outcome: Progressing   Problem: Fluid Volume: Goal: Will show no signs and symptoms of excessive bleeding Outcome: Progressing

## 2018-07-02 NOTE — Progress Notes (Signed)
[facility] [place] NAME: Alice Clark    MR#:  [id number]  DATE OF BIRTH:  06/22/1966  SUBJECTIVE:  CHIEF COMPLAINT:   Chief Complaint  Patient presents with  . Chest Pain   -Patient with metastatic ovarian cancer diagnosed in February 2019, last treatment on 05/22/2018.  Has ongoing progressive disease.  Currently treatment on hold.  Admitted for melanotic stools. -Received 2 units packed RBC transfusion. She complains of weakness, looks like drowsy.  She had bowel movement after enema yesterday. REVIEW OF SYSTEMS:  Review of Systems  Constitutional: Positive for malaise/fatigue. Negative for chills and fever.  HENT: Negative for congestion, ear discharge, hearing loss and nosebleeds.   Eyes: Negative for blurred vision and double vision.  Respiratory: Negative for cough, shortness of breath and wheezing.   Cardiovascular: Negative for chest pain and palpitations.  Gastrointestinal: Negative for abdominal pain, blood in stool, constipation, diarrhea, melena, nausea and vomiting.  Genitourinary: Negative for dysuria and urgency.  Musculoskeletal: Negative for myalgias.  Neurological: Negative for dizziness, focal weakness, seizures, weakness and headaches.  Psychiatric/Behavioral: Negative for depression.    DRUG ALLERGIES:   Allergies  Allergen Reactions  . Cefepime Other (See Comments)    VITALS:  Blood pressure 98/71, pulse (!) 121, temperature 99.1 F (37.3 C), temperature source Oral, resp. rate 18, height 5\' 4"  (1.626 m), weight 130 lb (59 kg), SpO2 100 %.  PHYSICAL EXAMINATION:  Physical Exam  GENERAL:  52 y.o.-year-old frail-appearing patient lying in the bed with no acute distress.  EYES: Pupils equal, round, reactive to light and accommodation. No scleral icterus. Extraocular muscles intact.  HEENT: Head atraumatic, normocephalic. Oropharynx and nasopharynx clear.  NECK:  Supple, no jugular venous distention. No  thyroid enlargement, no tenderness.  LUNGS: Normal breath sounds bilaterally, no wheezing, rales,rhonchi or crepitation. No use of accessory muscles of respiration.  Decreased bibasilar breath sounds. CARDIOVASCULAR: S1, S2 normal. No murmurs, rubs, or gallops.  ABDOMEN: Soft, no abdomen tenderness or distention, no rigidity or guarding. Bowel sounds present. No organomegaly or mass.  EXTREMITIES: No pedal edema, cyanosis, or clubbing.  NEUROLOGIC: Cranial nerves II through XII are intact. Muscle strength 3/5 in all extremities. Sensation intact. Gait not checked.  Global weakness noted PSYCHIATRIC: The patient is alert and oriented x 3.  Looks drowsy. SKIN: No obvious rash, lesion, or ulcer.    LABORATORY PANEL:   CBC Recent Labs  Lab 07/02/18 0320  WBC 33.5*  HGB 8.3*  HCT 24.6*  PLT 156   ------------------------------------------------------------------------------------------------------------------  Chemistries  Recent Labs  Lab 07/01/18 0543 07/02/18 0320  NA 137 137  K 4.3 4.5  CL 108 109  CO2 20* 19*  GLUCOSE 121* 155*  BUN 14 10  CREATININE 0.71 0.49  CALCIUM 5.6* 5.4*  MG 1.8  --   AST  --  21  ALT  --  11  ALKPHOS  --  200*  BILITOT  --  1.9*   ------------------------------------------------------------------------------------------------------------------  Cardiac Enzymes Recent Labs  Lab 07/02/18 0320  TROPONINI 0.09*   ------------------------------------------------------------------------------------------------------------------  RADIOLOGY:  Dg Chest Port 1 View  Result Date: 07/02/2018 CLINICAL DATA:  Dyspnea EXAM: PORTABLE CHEST 1 VIEW COMPARISON:  06/29/2018 FINDINGS: Low lung volumes with atelectasis and/or scarring at the lung bases more so on the left. Port catheter tip terminates at the cavoatrial junction. Heart size and mediastinal contours are stable. Lytic destruction of the lateral left fifth rib as before. No other osseous  abnormality.  Probable trace left effusion. IMPRESSION: Low lung volumes with bibasilar left greater than right atelectasis as before. Known lytic abnormality of the lateral left fifth rib without significant change. Electronically Signed   By: Ashley Royalty M.D.   On: 07/02/2018 03:12    EKG:   Orders placed or performed during the hospital encounter of 06/29/18  . EKG 12-Lead  . EKG 12-Lead  . EKG 12-Lead  . EKG 12-Lead  . ED EKG  . ED EKG  . EKG 12-Lead  . EKG 12-Lead  . EKG 12-Lead  . EKG 12-Lead    ASSESSMENT AND PLAN:   52 year old female with progressive metastatic ovarian cancer currently treatment on hold, chronic pain secondary to bone medicine stasis presents to hospital secondary to melena and GI bleed  1.  Melanotic stools-secondary to upper GI bleed -Upper GI endoscopy at [facility] 1 month ago showing severe grade 4 esophagitis.  And patient has been taking  NSAIDs too.  Could be also related to that. -Continue Protonix bid.  Appreciate GI consult -No plans to do any repeat procedures at this time -Continue to monitor hemoglobin. Transfuse to keep hemoglobin greater than 7.  Patient received several transfusions last month at [facility]. Hemoglobin is stable at 8.3.  2.  Sepsis-not sure if sepsis is present.  Patient has elevated white count. -However her WBC counts at [facility] were also elevated as high as 34.5 on 05/27/2018 and were down to 24.3 at 06/11/2018 -Continue with broad-spectrum antibiotics for now, she is on vancomycin and Zosyn.  blood culture is negative so far.  Procalcitonin is 3.53. -Patient on Decadron as outpatient.  3.  Severe esophagitis-on Protonix and also sucralfate  4.  Hypokalemia, hypomagnesemia and hypocalcemia-being replaced appropriately.  Improved. -Corrected calcium within normal limits. -IV albumin was given.  5.  Metastatic ovarian cancer-with liver and bone mets. -Follows with [facility] oncology.  Progressive disease in spite of chemotherapy.  Last chemo  at the end of June 2019.  Currently on hold. -Patient very hopeful that she could recover and get back with chemotherapy -Probably unrealistic expectations. Patient is followed by hospice at home.   Per palliative care team, she has spoken with palliative care at [facility] before and was not interested in support from palliative care during this hospitalization.   6.  DVT prophylaxis-teds and SCDs  Constipation.  Started MiraLAX daily per Dr. Bonna Gains.  She had bowel movement. Hypophosphatemia.  IV phosphate.  Follow-up level. Overall poor prognosis.  Physical therapy consult when medically stable Per palliative care staff, patient is FULL CODE. All the records are reviewed and case discussed with Care Management/Social Workerr. Management plans discussed with the patient, her mother and they are in agreement.  CODE STATUS: Full code  TOTAL TIME TAKING CARE OF THIS PATIENT: 37 minutes.   POSSIBLE D/C IN 2 DAYS, DEPENDING ON CLINICAL CONDITION.   Demetrios Loll M.D on 07/02/2018 at 1:38 PM  Between 7am to 6pm - Pager - [phone]  After 6pm go to [email or web address] - password [email or web address] [facility]  Office  [phone]  CC: Primary care physician; Donnie Coffin, MD

## 2018-07-02 NOTE — Consult Note (Signed)
Pharmacy Antibiotic Note  Alice Clark is a 52 y.o. female admitted on 06/29/2018 with sepsis.  Pharmacy has been consulted for Zosyn and Vancomycin dosing. Patient received vancomycin 1g IV and Zosyn 3.375 IV x 1 dose in ED.   Plan: Ke: 0.079   T1/2: 9   Vd: 41.3  Start Vancomycin 1000 IV every 12 hours with 6 hour stack dosing.  Goal trough 15-20 mcg/mL. Calculated trough at Css is 18. Trough level ordered prior to 4th dose.   0806 AM vanc level 34. Order changed to PRN. Random level with tomorrow AM labs.   8/7 AM vanc random 14. Restarted at 1 gram q 24 hours. Level before 3rd new dose.  Start Zosyn 3.375 IV EI every 8 hours.    Height: 5\' 4"  (162.6 cm) Weight: 130 lb (59 kg) IBW/kg (Calculated) : 54.7  Temp (24hrs), Avg:98.2 F (36.8 C), Min:97.4 F (36.3 C), Max:98.4 F (36.9 C)  Recent Labs  Lab 06/29/18 1026 06/29/18 1518 06/30/18 0457 07/01/18 0543 07/02/18 0320  WBC 38.7* 32.2*  --  35.7*  --   CREATININE 0.48 0.51 0.58 0.71  --   LATICACIDVEN 1.7 1.2  --   --  1.0  VANCOTROUGH  --   --   --  34*  --   VANCORANDOM  --   --   --   --  14    Estimated Creatinine Clearance: 71.8 mL/min (by C-G formula based on SCr of 0.71 mg/dL).    Allergies  Allergen Reactions  . Cefepime Other (See Comments)    Antimicrobials this admission: 8/4 Zosyn >>  84 Vancomycin  >>   Microbiology results: 8/4 BCx: pending 8/4 UCx: pending  Thank you for allowing pharmacy to be a part of this patient's care.  Eloise Harman, PharmD, BCPS Clinical Pharmacist 07/02/2018 4:51 AM

## 2018-07-02 NOTE — Progress Notes (Signed)
Visit made. Patient seen lying in bed, appeared to be sleeping soundly. Daughter Shenice at bedside. Patient continues with poor oral intake, low blood pressure and elevated heart rate. She remains on IV antibiotics and IV fluids. Hemoglobin stable per chart review. She did take her oral medications this morning and has not required any PRN pain medication. Emotional support given. Will continue to follow and update hospice team. Flo Shanks RN, BSN, [facility] [place], hospital Liaison [phone]

## 2018-07-02 NOTE — Progress Notes (Signed)
Pharmacy Electrolyte Monitoring Consult:  Pharmacy consulted to assist in monitoring and replacing electrolytes in this 52 y.o. female admitted on 06/29/2018 with Chest Pain   Labs:  Sodium (mmol/L)  Date Value  07/02/2018 137   Potassium (mmol/L)  Date Value  07/02/2018 4.5   Magnesium (mg/dL)  Date Value  07/01/2018 1.8   Phosphorus (mg/dL)  Date Value  07/02/2018 1.2 (L)   Calcium (mg/dL)  Date Value  07/02/2018 5.4 (LL)   Albumin (g/dL)  Date Value  07/02/2018 2.0 (L)    Assessment/Plan: 8/6 AM: Phos:1.2  K:4.5, Corrected Ca:7.2 Sodium Phos 44mmol ordered x 1 dose.  Will continue with  MagOx 400mg  BID x 4 doses and Calcium Carb 400mg  BID x 4 doses. Recheck Phos level at 1800.  Will recheck all electrolytes with AM labs and continue to replace as needed.   8/7:  Called RN to remind her to draw Phos level scheduled for 1800,  RN says she has a hard time getting blood return from pt and pt refuses to allow RN to draw from other sites.  Phlebotomist is going to try to get blood sample later on tonight.   Orene Desanctis, PharmD Clinical Pharmacist 07/02/2018 9:42 PM

## 2018-07-03 ENCOUNTER — Inpatient Hospital Stay
Admit: 2018-07-03 | Discharge: 2018-07-03 | Disposition: A | Discharge status: Home / Self Care | Attending: Internal Medicine | Admitting: Internal Medicine

## 2018-07-03 LAB — PROCALCITONIN: PROCALCITONIN: 2.24 ng/mL

## 2018-07-03 LAB — ECHOCARDIOGRAM COMPLETE
Height: 64 in
Weight: 2080 oz

## 2018-07-03 LAB — CALCIUM, IONIZED: Calcium, Ionized, Serum: 3.2 mg/dL — ABNORMAL LOW (ref 4.5–5.6)

## 2018-07-03 LAB — TROPONIN I
TROPONIN I: 0.16 ng/mL — AB (ref <0.03)
TROPONIN I: 0.03 ng/mL — AB (ref <0.03)
TROPONIN I: 0.03 ng/mL — AB (ref <0.03)
Troponin I: 0.09 ng/mL (ref <0.03)

## 2018-07-03 LAB — GLUCOSE, CAPILLARY
GLUCOSE-CAPILLARY: 125 mg/dL — AB (ref 70–99)
Glucose-Capillary: 163 mg/dL — ABNORMAL HIGH (ref 70–99)
Glucose-Capillary: 118 mg/dL — ABNORMAL HIGH (ref 70–99)
Glucose-Capillary: 148 mg/dL — ABNORMAL HIGH (ref 70–99)

## 2018-07-03 LAB — BASIC METABOLIC PANEL
ANION GAP: 10 (ref 5–15)
BUN: 9 mg/dL (ref 6–20)
CHLORIDE: 107 mmol/L (ref 98–111)
CO2: 21 mmol/L — AB (ref 22–32)
Calcium: 5.1 mg/dL — CL (ref 8.9–10.3)
Creatinine, Ser: 0.43 mg/dL — ABNORMAL LOW (ref 0.44–1.00)
GFR calc non Af Amer: >60 mL/min (ref >60)
Glucose, Bld: 162 mg/dL — ABNORMAL HIGH (ref 70–99)
POTASSIUM: 4.1 mmol/L (ref 3.5–5.1)
SODIUM: 138 mmol/L (ref 135–145)

## 2018-07-03 LAB — MAGNESIUM: Magnesium: 1.2 mg/dL — ABNORMAL LOW (ref 1.7–2.4)

## 2018-07-03 LAB — PHOSPHORUS: Phosphorus: 2.3 mg/dL — ABNORMAL LOW (ref 2.5–4.6)

## 2018-07-03 MED ORDER — SODIUM CHLORIDE 0.9 % IV SOLN
2.0000 g | Freq: Once | INTRAVENOUS | Status: AC
  Administered 2018-07-03: 2 g via INTRAVENOUS
  Filled 2018-07-03: qty 20

## 2018-07-03 MED ORDER — DOCUSATE SODIUM 100 MG PO CAPS
100.0000 mg | ORAL_CAPSULE | Freq: Two times a day (BID) | ORAL | Status: DC
  Administered 2018-07-03 – 2018-07-04 (×3): 100 mg via ORAL
  Filled 2018-07-03 (×3): qty 1

## 2018-07-03 MED ORDER — SODIUM PHOSPHATES 45 MMOLE/15ML IV SOLN
10.0000 mmol | Freq: Once | INTRAVENOUS | Status: AC
  Administered 2018-07-03: 10 mmol via INTRAVENOUS
  Filled 2018-07-03: qty 3.33

## 2018-07-03 MED ORDER — MAGNESIUM SULFATE 4 GM/100ML IV SOLN
4.0000 g | Freq: Once | INTRAVENOUS | Status: AC
  Administered 2018-07-03: 4 g via INTRAVENOUS
  Filled 2018-07-03: qty 100

## 2018-07-03 MED ORDER — MAGNESIUM SULFATE 2 GM/50ML IV SOLN
2.0000 g | Freq: Once | INTRAVENOUS | Status: DC
  Filled 2018-07-03: qty 50

## 2018-07-03 MED ORDER — METOPROLOL TARTRATE 25 MG PO TABS
12.5000 mg | ORAL_TABLET | Freq: Two times a day (BID) | ORAL | Status: DC
  Administered 2018-07-03: 12.5 mg via ORAL
  Filled 2018-07-03 (×2): qty 1

## 2018-07-03 NOTE — Progress Notes (Signed)
[facility] NAME: Alice Clark    MR#:  [id number]  DATE OF BIRTH:  1966/03/29  SUBJECTIVE:  CHIEF COMPLAINT:   Chief Complaint  Patient presents with  . Chest Pain   -Patient with metastatic ovarian cancer diagnosed in February 2019, last treatment on 05/22/2018.  Has ongoing progressive disease.  Currently treatment on hold.  Admitted for melanotic stools. -Received 2 units packed RBC transfusion. Had some chest pain since last night. Also have tachycardia.  REVIEW OF SYSTEMS:  Review of Systems  Constitutional: Positive for malaise/fatigue. Negative for chills and fever.  HENT: Negative for congestion, ear discharge, hearing loss and nosebleeds.   Eyes: Negative for blurred vision and double vision.  Respiratory: Negative for cough, shortness of breath and wheezing.   Cardiovascular: Negative for chest pain and palpitations.  Gastrointestinal: Negative for abdominal pain, blood in stool, constipation, diarrhea, melena, nausea and vomiting.  Genitourinary: Negative for dysuria and urgency.  Musculoskeletal: Negative for myalgias.  Neurological: Negative for dizziness, focal weakness, seizures, weakness and headaches.  Psychiatric/Behavioral: Negative for depression.    DRUG ALLERGIES:   Allergies  Allergen Reactions  . Cefepime Other (See Comments)    VITALS:  Blood pressure 99/68, pulse (!) 112, temperature 98.3 F (36.8 C), temperature source Oral, resp. rate 16, height 5\' 4"  (1.626 m), weight 59 kg, SpO2 100 %.  PHYSICAL EXAMINATION:  Physical Exam  GENERAL:  52 y.o.-year-old frail-appearing patient lying in the bed with no acute distress.  EYES: Pupils equal, round, reactive to light and accommodation. No scleral icterus. Extraocular muscles intact.  HEENT: Head atraumatic, normocephalic. Oropharynx and nasopharynx clear.  NECK:  Supple, no jugular venous distention. No thyroid enlargement, no tenderness.    LUNGS: Normal breath sounds bilaterally, no wheezing, rales,rhonchi or crepitation. No use of accessory muscles of respiration.  Decreased bibasilar breath sounds. CARDIOVASCULAR: S1, S2 fast, regular. No murmurs, rubs, or gallops.  ABDOMEN: Soft, no abdomen tenderness or distention, no rigidity or guarding. Bowel sounds present. No organomegaly or mass.  EXTREMITIES: No pedal edema, cyanosis, or clubbing.  NEUROLOGIC: Cranial nerves II through XII are intact. Muscle strength 3/5 in all extremities. Sensation intact. Gait not checked.  Global weakness noted PSYCHIATRIC: The patient is alert and oriented x 3.  SKIN: No obvious rash, lesion, or ulcer.   LABORATORY PANEL:   CBC Recent Labs  Lab 07/02/18 0320  WBC 33.5*  HGB 8.3*  HCT 24.6*  PLT 156   ------------------------------------------------------------------------------------------------------------------  Chemistries  Recent Labs  Lab 07/02/18 0320 07/03/18 0508  NA 137 138  K 4.5 4.1  CL 109 107  CO2 19* 21*  GLUCOSE 155* 162*  BUN 10 9  CREATININE 0.49 0.43*  CALCIUM 5.4* 5.1*  MG  --  1.2*  AST 21  --   ALT 11  --   ALKPHOS 200*  --   BILITOT 1.9*  --    ------------------------------------------------------------------------------------------------------------------  Cardiac Enzymes Recent Labs  Lab 07/03/18 1727  TROPONINI 0.03*   ------------------------------------------------------------------------------------------------------------------  RADIOLOGY:  Dg Chest Port 1 View  Result Date: 07/02/2018 CLINICAL DATA:  Dyspnea EXAM: PORTABLE CHEST 1 VIEW COMPARISON:  06/29/2018 FINDINGS: Low lung volumes with atelectasis and/or scarring at the lung bases more so on the left. Port catheter tip terminates at the cavoatrial junction. Heart size and mediastinal contours are stable. Lytic destruction of the lateral left fifth rib as before. No other osseous abnormality. Probable trace left effusion.  IMPRESSION: Low lung  volumes with bibasilar left greater than right atelectasis as before. Known lytic abnormality of the lateral left fifth rib without significant change. Electronically Signed   By: Ashley Royalty M.D.   On: 07/02/2018 03:12    EKG:   Orders placed or performed during the hospital encounter of 06/29/18  . EKG 12-Lead  . EKG 12-Lead  . EKG 12-Lead  . EKG 12-Lead  . ED EKG  . ED EKG  . EKG 12-Lead  . EKG 12-Lead  . EKG 12-Lead  . EKG 12-Lead  . EKG 12-Lead  . EKG 12-Lead  . EKG 12-Lead  . EKG 12-Lead    ASSESSMENT AND PLAN:   52 year old female with progressive metastatic ovarian cancer currently treatment on hold, chronic pain secondary to bone medicine stasis presents to hospital secondary to melena and GI bleed  *  Melanotic stools-secondary to upper GI bleed -Upper GI endoscopy at [facility] 1 month ago showing severe grade 4 esophagitis.  And patient has been taking  NSAIDs too.  Could be also related to that. -Continue Protonix bid.  Appreciate GI consult -No plans to do any repeat procedures at this time -Continue to monitor hemoglobin. Transfuse to keep hemoglobin greater than 7.  Patient received several transfusions last month at [facility]. Hemoglobin is stable at 8.3.  * Chest pain   Have pressure like chest pain   Have tachycardia on tele.   May be due to anemia and stress.   Demand ischemia   Serial troponin, slightly high.   Due to GI bleed, and co-morbidities- cardio suggest - no invasive work ups.  ASA, if permitted- may start after one week  Cont nitro.  *  Sepsis-  Patient has elevated white count. -However her WBC counts at [facility] were also elevated as high as 34.5 on 05/27/2018 and were down to 24.3 at 06/11/2018 -Continue with broad-spectrum antibiotics for now, she is on vancomycin and Zosyn.  blood culture is negative so far.  Procalcitonin is 3.53. -Patient on Decadron as outpatient.  *  Severe esophagitis-on Protonix and also sucralfate  *   Hypokalemia, hypomagnesemia and hypocalcemia-being replaced appropriately.  Improved. -Corrected calcium within normal limits. -IV albumin was given.  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 -Follows with [facility] oncology.  Progressive disease in spite of chemotherapy.  Last chemo at the end of June 2019.  Currently on hold. -Patient very hopeful that she could recover and get back with chemotherapy -Probably unrealistic expectations. Patient is followed by hospice at home.   Per palliative care team, she has spoken with palliative care at [facility] before and was not interested in support from palliative care during this hospitalization.   *  DVT prophylaxis-teds and SCDs  * Constipation.  Started MiraLAX daily per Dr. Bonna Gains.  She had bowel movement.  * Hypophosphatemia.  IV phosphate.  Follow-up level.  * Tachycardia   Start on small dose metoprolol, due to soft BP>  Overall poor prognosis.  Physical therapy consult when medically stable Per palliative care staff, patient is FULL CODE. All the records are reviewed and case discussed with Care Management/Social Workerr. Management plans discussed with the patient, her mother and they are in agreement.  CODE STATUS: Full code  TOTAL TIME TAKING CARE OF THIS PATIENT: 37 minutes.   POSSIBLE D/C IN 2 DAYS, DEPENDING ON CLINICAL CONDITION.   Vaughan Basta M.D on 07/03/2018 at 6:19 PM  Between 7am to 6pm - Pager - [phone]  After 6pm go to [email or web address] -  password [email or web address] [facility]  Office  [phone]  CC: Primary care physician; Donnie Coffin, MD

## 2018-07-03 NOTE — Progress Notes (Signed)
Vonda Antigua, MD [address], [place], [place], [place], [address] [address], [place], [place], [place], [address] Phone: [phone]  Fax: [phone]   Subjective: Patient doing much better from bloating and constipation perspective.  Taking MiraLAX and having soft bowel movements.  No further melena.   Objective: Exam: Vital signs in last 24 hours: Vitals:   07/02/18 2250 07/03/18 0135 07/03/18 0503 07/03/18 0823  BP: 99/64 104/64 96/61 99/68   Pulse: (!) 125 (!) 114 (!) 111 (!) 112  Resp: (!) 36 18 18 16   Temp: 99.3 F (37.4 C) 98.4 F (36.9 C) 98.6 F (37 C) 98.3 F (36.8 C)  TempSrc: Oral Oral Oral Oral  SpO2: 100% 97% 98% 100%  Weight:      Height:       Weight change:   Intake/Output Summary (Last 24 hours) at 07/03/2018 1331 Last data filed at 07/03/2018 0300 Gross per 24 hour  Intake 331.25 ml  Output -  Net 331.25 ml    General: No acute distress, AAO x3 Abd: Soft, NT/ND, No HSM Skin: Warm, no rashes Neck: Supple, Trachea midline   Lab Results: Lab Results  Component Value Date   WBC 33.5 (H) 07/02/2018   HGB 8.3 (L) 07/02/2018   HCT 24.6 (L) 07/02/2018   MCV 91.1 07/02/2018   PLT 156 07/02/2018   Micro Results: Recent Results (from the past 240 hour(s))  Urine culture     Status: None   Collection Time: 06/29/18 10:26 AM  Result Value Ref Range Status   Specimen Description   Final    URINE, RANDOM Performed at [facility], [address], [place], [place] [address]    Special Requests   Final    NONE Performed at [facility], [address] [place], [place] [address]    Culture   Final    NO GROWTH Performed at [facility], [place] [place], [place] [address]    Report Status 06/30/2018 FINAL  Final  Culture, blood (routine x 2)     Status: None (Preliminary result)   Collection Time: 06/29/18 10:59 AM  Result Value Ref Range Status   Specimen Description BLOOD PORTA CATH  Final     Special Requests   Final    BOTTLES DRAWN AEROBIC AND ANAEROBIC Blood Culture results may not be optimal due to an excessive volume of blood received in culture bottles   Culture   Final    NO GROWTH 4 DAYS Performed at [facility], [address], [place], [place] [address]    Report Status PENDING  Incomplete  Culture, blood (x 2)     Status: None (Preliminary result)   Collection Time: 06/29/18  3:28 PM  Result Value Ref Range Status   Specimen Description BLOOD BLOOD RIGHT HAND  Final   Special Requests   Final    BOTTLES DRAWN AEROBIC AND ANAEROBIC Blood Culture adequate volume   Culture   Final    NO GROWTH 4 DAYS Performed at [facility], [address], [place],  [address]    Report Status PENDING  Incomplete  Culture, blood (x 2)     Status: None (Preliminary result)   Collection Time: 06/29/18  3:38 PM  Result Value Ref Range Status   Specimen Description BLOOD BLOOD LEFT HAND  Final   Special Requests   Final    BOTTLES DRAWN AEROBIC AND ANAEROBIC Blood Culture adequate volume   Culture  Final    NO GROWTH 4 DAYS Performed at [facility], [address] [place], [place] [address]    Report Status PENDING  Incomplete   Studies/Results: Dg Chest Port 1 View  Result Date: 07/02/2018 CLINICAL DATA:  Dyspnea EXAM: PORTABLE CHEST 1 VIEW COMPARISON:  06/29/2018 FINDINGS: Low lung volumes with atelectasis and/or scarring at the lung bases more so on the left. Port catheter tip terminates at the cavoatrial junction. Heart size and mediastinal contours are stable. Lytic destruction of the lateral left fifth rib as before. No other osseous abnormality. Probable trace left effusion. IMPRESSION: Low lung volumes with bibasilar left greater than right atelectasis as before. Known lytic abnormality of the lateral left fifth rib without significant change. Electronically Signed   By: Ashley Royalty M.D.   On: 07/02/2018 03:12   Medications:   Scheduled Meds: . acetaminophen  650 mg Oral Once  . dexamethasone  2 mg Oral BID  . diphenhydrAMINE  25 mg Oral Once  . docusate sodium  100 mg Oral BID  . feeding supplement (ENSURE ENLIVE)  237 mL Oral TID BM  . gi cocktail  30 mL Oral Once  . insulin aspart  0-5 Units Subcutaneous QHS  . insulin aspart  0-9 Units Subcutaneous TID WC  . lactobacillus  1 g Oral TID WC  . magnesium hydroxide  30 mL Oral Daily  . multivitamin-lutein  1 capsule Oral Daily  . nystatin  5 mL Oral BID  . pantoprazole  40 mg Oral BID AC  . polycarbophil  625 mg Oral Daily  . polyethylene glycol  17 g Oral Daily  . potassium chloride  10 mEq Oral Daily  . sucralfate  1 g Oral Q6H   Continuous Infusions: . calcium gluconate    . magnesium sulfate 1 - 4 g bolus IVPB    . piperacillin-tazobactam (ZOSYN)  IV Stopped (07/03/18 0905)  . sodium phosphate  Dextrose 5% IVPB 10 mmol (07/03/18 0942)  . vancomycin Stopped (07/03/18 0612)   PRN Meds:.acetaminophen **OR** acetaminophen, fentaNYL (SUBLIMAZE) injection, HYDROcodone-acetaminophen, ketorolac, nitroGLYCERIN, ondansetron **OR** ondansetron (ZOFRAN) IV, sodium phosphate   Assessment: Active Problems:   Melena   Pressure injury of skin   Primary malignant neoplasm of ovary with widespread metastatic disease (HCC)   Protein-calorie malnutrition, severe    Plan: Melena has resolved and hemoglobin is stable No further signs of active GI bleeding Continue PPI twice daily Antireflux measures, keep head of bed elevated 30 degrees given history of esophagitis Constipation resolved as well High-fiber diet, MiraLAX twice daily should be continued  GI service will sign off, please page with any questions    LOS: 4 days   Vonda Antigua, MD 07/03/2018, 1:31 PM

## 2018-07-03 NOTE — Consult Note (Signed)
Alice Clark is a 52 y.o. female  [id number]  Primary Cardiologist: Neoma Laming Reason for Consultation: chest pain  HPI: this is a 52 year old African-American female with history of ovarian cancer status post chemotherapy and radiation now on palliative care presented to the hospital with chest pain and black colored stools. Patient is tachycardic that became short of breath and had electrolyte disturbances as well as severe anemia.   Review of Systems: no orthopnea PND or leg swelling   Past Medical History:  Diagnosis Date  . Diabetes mellitus without complication ([place])   . Diverticulitis     Medications Prior to Admission  Medication Sig Dispense Refill  . dexamethasone (DECADRON) 2 MG tablet Take 2 mg by mouth 2 (two) times daily with a meal.    . magnesium hydroxide (MILK OF MAGNESIA) 400 MG/5ML suspension Take 30 mLs by mouth daily.    . metFORMIN (GLUCOPHAGE) 500 MG tablet Take 1 tablet (500 mg total) by mouth 2 (two) times daily with a meal. 60 tablet 1  . metoprolol tartrate (LOPRESSOR) 25 MG tablet Take 25 mg by mouth 2 (two) times daily.    Marland Kitchen nystatin (MYCOSTATIN) 100000 UNIT/ML suspension Take 5 mLs by mouth 2 (two) times daily.    . pantoprazole sodium (PROTONIX) 40 mg/20 mL PACK Take 10 mLs by mouth daily.    . potassium chloride (KCL) 2 mEq/mL SOLN oral liquid Take 5 mLs by mouth daily.    . sucralfate (CARAFATE) 1 GM/10ML suspension Take 10 mLs by mouth every 6 (six) hours.       Marland Kitchen acetaminophen  650 mg Oral Once  . calcium carbonate  400 mg of elemental calcium Oral BID  . dexamethasone  2 mg Oral BID  . diphenhydrAMINE  25 mg Oral Once  . feeding supplement (ENSURE ENLIVE)  237 mL Oral TID BM  . gi cocktail  30 mL Oral Once  . insulin aspart  0-5 Units Subcutaneous QHS  . insulin aspart  0-9 Units Subcutaneous TID WC  . lactobacillus  1 g Oral TID WC  . magnesium hydroxide  30 mL Oral Daily  . multivitamin-lutein  1 capsule Oral Daily  . nystatin   5 mL Oral BID  . pantoprazole  40 mg Oral BID AC  . polycarbophil  625 mg Oral Daily  . polyethylene glycol  17 g Oral Daily  . potassium chloride  10 mEq Oral Daily  . sucralfate  1 g Oral Q6H    Infusions: . calcium gluconate    . magnesium sulfate 1 - 4 g bolus IVPB    . piperacillin-tazobactam (ZOSYN)  IV 3.375 g (07/03/18 0505)  . sodium phosphate  Dextrose 5% IVPB    . vancomycin Stopped (07/03/18 0612)    Allergies  Allergen Reactions  . Cefepime Other (See Comments)    Social History   Socioeconomic History  . Marital status: Divorced    Spouse name: Not on file  . Number of children: Not on file  . Years of education: Not on file  . Highest education level: Not on file  Occupational History  . Not on file  Social Needs  . Financial resource strain: Not on file  . Food insecurity:    Worry: Not on file    Inability: Not on file  . Transportation needs:    Medical: Not on file    Non-medical: Not on file  Tobacco Use  . Smoking status: Never Smoker  . Smokeless tobacco:  Never Used  Substance and Sexual Activity  . Alcohol use: No    Frequency: Never  . Drug use: No  . Sexual activity: Not on file  Lifestyle  . Physical activity:    Days per week: Not on file    Minutes per session: Not on file  . Stress: Not on file  Relationships  . Social connections:    Talks on phone: Not on file    Gets together: Not on file    Attends religious service: Not on file    Active member of club or organization: Not on file    Attends meetings of clubs or organizations: Not on file    Relationship status: Not on file  . Intimate partner violence:    Fear of current or ex partner: Not on file    Emotionally abused: Not on file    Physically abused: Not on file    Forced sexual activity: Not on file  Other Topics Concern  . Not on file  Social History Narrative  . Not on file    Family History  Problem Relation Age of Onset  . Breast cancer Neg Hx      PHYSICAL EXAM: Vitals:   07/03/18 0503 07/03/18 0823  BP: 96/61 99/68  Pulse: (!) 111 (!) 112  Resp: 18 16  Temp: 98.6 F (37 C) 98.3 F (36.8 C)  SpO2: 98% 100%     Intake/Output Summary (Last 24 hours) at 07/03/2018 0854 Last data filed at 07/03/2018 0300 Gross per 24 hour  Intake 331.25 ml  Output -  Net 331.25 ml    General:  Well appearing. No respiratory difficulty HEENT: normal Neck: supple. no JVD. Carotids 2+ bilat; no bruits. No lymphadenopathy or thryomegaly appreciated. Cor: PMI nondisplaced. Regular rate & rhythm. No rubs, gallops or murmurs. Lungs: clear Abdomen: soft, nontender, nondistended. No hepatosplenomegaly. No bruits or masses. Good bowel sounds. Extremities: no cyanosis, clubbing, rash, edema Neuro: alert & oriented x 3, cranial nerves grossly intact. moves all 4 extremities w/o difficulty. Affect pleasant.  DVV:OHYWV tachycardia with frequent APCs and anterolateral ischemia with poor R-wave progression.  Results for orders placed or performed during the hospital encounter of 06/29/18 (from the past 24 hour(s))  Glucose, capillary     Status: Abnormal   Collection Time: 07/02/18  9:34 AM  Result Value Ref Range   Glucose-Capillary 151 (H) 70 - 99 mg/dL  Glucose, capillary     Status: Abnormal   Collection Time: 07/02/18 11:45 AM  Result Value Ref Range   Glucose-Capillary 146 (H) 70 - 99 mg/dL  Glucose, capillary     Status: Abnormal   Collection Time: 07/02/18  5:02 PM  Result Value Ref Range   Glucose-Capillary 156 (H) 70 - 99 mg/dL  Glucose, capillary     Status: Abnormal   Collection Time: 07/02/18  9:21 PM  Result Value Ref Range   Glucose-Capillary 145 (H) 70 - 99 mg/dL  Troponin I     Status: Abnormal   Collection Time: 07/02/18 11:21 PM  Result Value Ref Range   Troponin I 0.05 (HH) <0.03 ng/mL  Procalcitonin     Status: None   Collection Time: 07/03/18  5:08 AM  Result Value Ref Range   Procalcitonin 2.24 ng/mL  Magnesium      Status: Abnormal   Collection Time: 07/03/18  5:08 AM  Result Value Ref Range   Magnesium 1.2 (L) 1.7 - 2.4 mg/dL  Phosphorus     Status: Abnormal  Collection Time: 07/03/18  5:08 AM  Result Value Ref Range   Phosphorus 2.3 (L) 2.5 - 4.6 mg/dL  Basic metabolic panel     Status: Abnormal   Collection Time: 07/03/18  5:08 AM  Result Value Ref Range   Sodium 138 135 - 145 mmol/L   Potassium 4.1 3.5 - 5.1 mmol/L   Chloride 107 98 - 111 mmol/L   CO2 21 (L) 22 - 32 mmol/L   Glucose, Bld 162 (H) 70 - 99 mg/dL   BUN 9 6 - 20 mg/dL   Creatinine, Ser 0.43 (L) 0.44 - 1.00 mg/dL   Calcium 5.1 (LL) 8.9 - 10.3 mg/dL   GFR calc non Af Amer >60 >60 mL/min   GFR calc Af Amer >60 >60 mL/min   Anion gap 10 5 - 15  Troponin I     Status: Abnormal   Collection Time: 07/03/18  5:08 AM  Result Value Ref Range   Troponin I 0.09 (HH) <0.03 ng/mL  Glucose, capillary     Status: Abnormal   Collection Time: 07/03/18  8:19 AM  Result Value Ref Range   Glucose-Capillary 163 (H) 70 - 99 mg/dL   Dg Chest Port 1 View  Result Date: 07/02/2018 CLINICAL DATA:  Dyspnea EXAM: PORTABLE CHEST 1 VIEW COMPARISON:  06/29/2018 FINDINGS: Low lung volumes with atelectasis and/or scarring at the lung bases more so on the left. Port catheter tip terminates at the cavoatrial junction. Heart size and mediastinal contours are stable. Lytic destruction of the lateral left fifth rib as before. No other osseous abnormality. Probable trace left effusion. IMPRESSION: Low lung volumes with bibasilar left greater than right atelectasis as before. Known lytic abnormality of the lateral left fifth rib without significant change. Electronically Signed   By: Ashley Royalty M.D.   On: 07/02/2018 03:12     ASSESSMENT AND PLAN: chest pain with mildly elevated troponin probably due to demand ischemia. Patient does have melanotic stool in it and severe anemia and may be related to GI disorder. However it could be due to coronary artery disease. EKG  does have some ischemic changes. Discussed thoroughly with the patient's family as well as the patient about cardiac catheterization which is the same procedure and patient has opted to treat conservatively treated. Patient does not want catheterization or angioplasty. Advise adding nitrates and aspirin if GI is okay with using aspirin. Will get echocardiogram to evaluate ejection fraction. Ameen Mostafa A

## 2018-07-03 NOTE — Progress Notes (Signed)
*  PRELIMINARY RESULTS* Echocardiogram 2D Echocardiogram has been performed.  Alice Clark 07/03/2018, 10:41 AM

## 2018-07-03 NOTE — Progress Notes (Addendum)
Called by nurse that patient was having chest pain.  She was given sublingual nitro without much improvement.  Patient had an episode of chest pain earlier, and had an elevated troponin at 0.09.  Repeat EKG tonight shows new T wave inversions in leads V1 through V3.  Repeat troponin is 0.05.  We will move the patient to a for further monitoring and order ACS work-up.  Cannot start anticoagulation due to GI bleed  Jacqulyn Bath [facility] 07/03/2018, 12:31 AM

## 2018-07-03 NOTE — Progress Notes (Signed)
Pharmacy Electrolyte Monitoring Consult:  Pharmacy consulted to assist in monitoring and replacing electrolytes in this 52 y.o. female admitted on 06/29/2018 with Chest Pain   Labs:  Sodium (mmol/L)  Date Value  07/03/2018 138   Potassium (mmol/L)  Date Value  07/03/2018 4.1   Magnesium (mg/dL)  Date Value  07/03/2018 1.2 (L)   Phosphorus (mg/dL)  Date Value  07/03/2018 2.3 (L)   Calcium (mg/dL)  Date Value  07/03/2018 5.1 (LL)   Albumin (g/dL)  Date Value  07/02/2018 2.0 (L)    Assessment/Plan: 8/6 AM: Phos:1.2  K:4.5, Corrected Ca:7.2 Sodium Phos 8mmol ordered x 1 dose.  Will continue with  MagOx 400mg  BID x 4 doses and Calcium Carb 400mg  BID x 4 doses. Recheck Phos level at 1800.  Will recheck all electrolytes with AM labs and continue to replace as needed.   8/7:  Called RN to remind her to draw Phos level scheduled for 1800,  RN says she has a hard time getting blood return from pt and pt refuses to allow RN to draw from other sites.  Phlebotomist is going to try to get blood sample later on tonight.  8/8 AM Mg 1.2, PO4  2.3, corrected calcium 7.5. Magnesium sulfate 4 grams, 10 mmol sodium phosphate, and 2 grams calcium gluconate ordered. Recheck all electrolytes with tomorrow AM labs.  Eloise Harman, PharmD Clinical Pharmacist 07/03/2018 6:59 AM

## 2018-07-03 NOTE — Progress Notes (Addendum)
Critical Calcium level of 5.1  and critical troponin of 0.09 reported. MD paged. Pt asymptomatic. Will continue to monitor.  Update 06:26. MD Marcille Blanco made aware. MD also made aware of 1.2 magnesium level. No new orders at this time.

## 2018-07-04 LAB — CULTURE, BLOOD (ROUTINE X 2)
CULTURE: NO GROWTH
Culture: NO GROWTH
Culture: NO GROWTH
SPECIAL REQUESTS: ADEQUATE
Special Requests: ADEQUATE

## 2018-07-04 LAB — BASIC METABOLIC PANEL
Anion gap: 6 (ref 5–15)
BUN: 10 mg/dL (ref 6–20)
CHLORIDE: 106 mmol/L (ref 98–111)
CO2: 22 mmol/L (ref 22–32)
Calcium: 5.3 mg/dL — CL (ref 8.9–10.3)
Creatinine, Ser: 0.43 mg/dL — ABNORMAL LOW (ref 0.44–1.00)
GFR calc non Af Amer: >60 mL/min (ref >60)
Glucose, Bld: 122 mg/dL — ABNORMAL HIGH (ref 70–99)
POTASSIUM: 4 mmol/L (ref 3.5–5.1)
SODIUM: 134 mmol/L — AB (ref 135–145)

## 2018-07-04 LAB — CBC
HCT: 25.9 % — ABNORMAL LOW (ref 35.0–47.0)
HEMOGLOBIN: 8.5 g/dL — AB (ref 12.0–16.0)
MCH: 30.2 pg (ref 26.0–34.0)
MCHC: 33 g/dL (ref 32.0–36.0)
MCV: 91.4 fL (ref 80.0–100.0)
PLATELETS: 161 10*3/uL (ref 150–440)
RBC: 2.83 MIL/uL — AB (ref 3.80–5.20)
RDW: 17.8 % — ABNORMAL HIGH (ref 11.5–14.5)
WBC: 43.8 10*3/uL — ABNORMAL HIGH (ref 3.6–11.0)

## 2018-07-04 LAB — GLUCOSE, CAPILLARY
Glucose-Capillary: 138 mg/dL — ABNORMAL HIGH (ref 70–99)
Glucose-Capillary: 100 mg/dL — ABNORMAL HIGH (ref 70–99)

## 2018-07-04 LAB — PROCALCITONIN: Procalcitonin: 2.04 ng/mL

## 2018-07-04 LAB — VANCOMYCIN, TROUGH: Vancomycin Tr: 15 ug/mL (ref 15–20)

## 2018-07-04 LAB — MAGNESIUM: MAGNESIUM: 1.9 mg/dL (ref 1.7–2.4)

## 2018-07-04 LAB — PHOSPHORUS: PHOSPHORUS: 2.6 mg/dL (ref 2.5–4.6)

## 2018-07-04 MED ORDER — ENSURE ENLIVE PO LIQD: 237.0000 mL | Freq: Three times a day (TID) | ORAL | 12 refills | Status: AC

## 2018-07-04 MED ORDER — OCUVITE-LUTEIN PO CAPS: 1.0000 | ORAL_CAPSULE | Freq: Every day | ORAL | 0 refills | Status: AC

## 2018-07-04 MED ORDER — FLORANEX PO PACK: 1.0000 g | PACK | Freq: Three times a day (TID) | ORAL | 0 refills | Status: AC

## 2018-07-04 MED ORDER — AMOXICILLIN-POT CLAVULANATE 875-125 MG PO TABS: 1.0000 | ORAL_TABLET | Freq: Two times a day (BID) | ORAL | 0 refills | Status: AC

## 2018-07-04 MED ORDER — HEPARIN SOD (PORK) LOCK FLUSH 100 UNIT/ML IV SOLN
500.0000 [IU] | INTRAVENOUS | Status: AC | PRN
  Administered 2018-07-04: 500 [IU]

## 2018-07-04 MED ORDER — PANTOPRAZOLE SODIUM 40 MG PO TBEC: 40.0000 mg | DELAYED_RELEASE_TABLET | Freq: Two times a day (BID) | ORAL | 0 refills | Status: AC

## 2018-07-04 MED ORDER — SODIUM CHLORIDE 0.9 % IV SOLN
2.0000 g | Freq: Once | INTRAVENOUS | Status: AC
  Administered 2018-07-04: 2 g via INTRAVENOUS
  Filled 2018-07-04: qty 20

## 2018-07-04 MED ORDER — METOPROLOL TARTRATE 25 MG PO TABS: 12.5000 mg | ORAL_TABLET | Freq: Two times a day (BID) | ORAL | 0 refills | Status: AC

## 2018-07-04 MED ORDER — CALCIUM POLYCARBOPHIL 625 MG PO TABS: 625.0000 mg | ORAL_TABLET | Freq: Every day | ORAL | 0 refills | Status: AC

## 2018-07-04 MED ORDER — HYDROCODONE-ACETAMINOPHEN 5-325 MG PO TABS: 1.0000 | ORAL_TABLET | Freq: Four times a day (QID) | ORAL | 0 refills | Status: AC | PRN

## 2018-07-04 MED ORDER — DOCUSATE SODIUM 100 MG PO CAPS: 100.0000 mg | ORAL_CAPSULE | Freq: Two times a day (BID) | ORAL | 0 refills | Status: AC

## 2018-07-04 MED ORDER — POLYETHYLENE GLYCOL 3350 17 G PO PACK: 17.0000 g | PACK | Freq: Every day | ORAL | 0 refills | Status: AC

## 2018-07-04 NOTE — Progress Notes (Signed)
Went over discharge instructions with the patient and daughter including medications and follow-up appointment. Discontinue telemetry monitor, peripheral IV and IV team de-access implanted port. Patient refuses EMS when son came to pick her up. Called EMS and cancelled transport. NT to help transport patient.

## 2018-07-04 NOTE — Consult Note (Signed)
Pharmacy Antibiotic Note  Alice Clark is a 52 y.o. female admitted on 06/29/2018 with sepsis.  Pharmacy has been consulted for Zosyn and Vancomycin dosing. Patient received vancomycin 1g IV and Zosyn 3.375 IV x 1 dose in ED.   Plan: Ke: 0.079   T1/2: 9   Vd: 41.3  Start Vancomycin 1000 IV every 12 hours with 6 hour stack dosing.  Goal trough 15-20 mcg/mL. Calculated trough at Css is 18. Trough level ordered prior to 4th dose.   0806 AM vanc level 34. Order changed to PRN. Random level with tomorrow AM labs.   8/7 AM vanc random 14. Restarted at 1 gram q 24 hours. Level before 3rd new dose.  8/9 AM vanc level 15. Continue current regimen.  Start Zosyn 3.375 IV EI every 8 hours.    Height: 5\' 4"  (162.6 cm) Weight: 130 lb (59 kg) IBW/kg (Calculated) : 54.7  Temp (24hrs), Avg:98.1 F (36.7 C), Min:97.5 F (36.4 C), Max:98.6 F (37 C)  Recent Labs  Lab 06/29/18 1026 06/29/18 1518 06/30/18 0457 07/01/18 0543 07/02/18 0320 07/03/18 0508 07/04/18 0531  WBC 38.7* 32.2*  --  35.7* 33.5*  --  43.8*  CREATININE 0.48 0.51 0.58 0.71 0.49 0.43* 0.43*  LATICACIDVEN 1.7 1.2  --   --  1.0  --   --   VANCOTROUGH  --   --   --  34*  --   --  15  VANCORANDOM  --   --   --   --  14  --   --     Estimated Creatinine Clearance: 71.8 mL/min (A) (by C-G formula based on SCr of 0.43 mg/dL (L)).    Allergies  Allergen Reactions  . Cefepime Other (See Comments)    Antimicrobials this admission: 8/4 Zosyn >>  84 Vancomycin  >>   Microbiology results: 8/4 BCx: pending 8/4 UCx: pending  Thank you for allowing pharmacy to be a part of this patient's care.  Eloise Harman, PharmD, BCPS Clinical Pharmacist 07/04/2018 6:33 AM

## 2018-07-04 NOTE — Progress Notes (Signed)
SUBJECTIVE: No further chest pain   Vitals:   07/03/18 0823 07/03/18 2059 07/04/18 0347 07/04/18 0742  BP: 99/68 98/68 (!) 87/66 92/65  Pulse: (!) 112 94 94 (!) 102  Resp: 16 18 18    Temp: 98.3 F (36.8 C) (!) 97.5 F (36.4 C) 98.6 F (37 C) 97.7 F (36.5 C)  TempSrc: Oral Oral  Oral  SpO2: 100% 100% 100% 100%  Weight:      Height:        Intake/Output Summary (Last 24 hours) at 07/04/2018 1107 Last data filed at 07/03/2018 1414 Gross per 24 hour  Intake 362.07 ml  Output -  Net 362.07 ml    LABS: Basic Metabolic Panel: Recent Labs    07/03/18 0508 07/04/18 0531  NA 138 134*  K 4.1 4.0  CL 107 106  CO2 21* 22  GLUCOSE 162* 122*  BUN 9 10  CREATININE 0.43* 0.43*  CALCIUM 5.1* 5.3*  MG 1.2* 1.9  PHOS 2.3* 2.6   Liver Function Tests: Recent Labs    07/02/18 0320  AST 21  ALT 11  ALKPHOS 200*  BILITOT 1.9*  PROT 5.8*  ALBUMIN 2.0*   No results for input(s): LIPASE, AMYLASE in the last 72 hours. CBC: Recent Labs    07/02/18 0320 07/04/18 0531  WBC 33.5* 43.8*  HGB 8.3* 8.5*  HCT 24.6* 25.9*  MCV 91.1 91.4  PLT 156 161   Cardiac Enzymes: Recent Labs    07/03/18 1128 07/03/18 1727 07/03/18 1908  TROPONINI 0.16* 0.03* 0.03*   BNP: Invalid input(s): POCBNP D-Dimer: No results for input(s): DDIMER in the last 72 hours. Hemoglobin A1C: No results for input(s): HGBA1C in the last 72 hours. Fasting Lipid Panel: No results for input(s): CHOL, HDL, LDLCALC, TRIG, CHOLHDL, LDLDIRECT in the last 72 hours. Thyroid Function Tests: No results for input(s): TSH, T4TOTAL, T3FREE, THYROIDAB in the last 72 hours.  Invalid input(s): FREET3 Anemia Panel: No results for input(s): VITAMINB12, FOLATE, FERRITIN, TIBC, IRON, RETICCTPCT in the last 72 hours.   PHYSICAL EXAM General: Well developed, well nourished, in no acute distress HEENT:  Normocephalic and atramatic Neck:  No JVD.  Lungs: Clear bilaterally to auscultation and percussion. Heart: HRRR .  Normal S1 and S2 without gallops or murmurs.  Abdomen: Bowel sounds are positive, abdomen soft and non-tender  Msk:  Back normal, normal gait. Normal strength and tone for age. Extremities: No clubbing, cyanosis or edema.   Neuro: Alert and oriented X 3. Psych:  Good affect, responds appropriately  TELEMETRY: NSR  ASSESSMENT AND PLAN: Atypical chest pain with feeling  better with treatment of GERD. May f/u 1 week in office.  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

## 2018-07-04 NOTE — Discharge Summary (Signed)
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 Diabetes mellitus without complication ([place])   . Diverticulitis     HOSPITAL COURSE:   52 year old female with progressive metastatic ovarian cancer currently treatment on hold, chronic pain secondary to bone medicine stasis presents to hospital secondary to melena and GI bleed  *  Melanotic stools-secondary to upper GI bleed -Upper GI endoscopy at [facility] 1 month ago showing severe grade 4 esophagitis.  And patient has been taking NSAIDs too.  Could be also related to that. -Continue Protonix bid.  Appreciate GI consult -No plans to do any repeat procedures at this time -Continue to monitor hemoglobin. Transfuse to keep hemoglobin greater than 7.  Patient received several transfusions last month at [facility]. Hemoglobin is stable at 8.3.  Due to this issue, will not start on aspirin for now, may start in future in clinic, if  cardiology feels the need.  * Chest pain   Have pressure like chest pain   Have tachycardia on tele.   May be due to anemia and stress.   Demand ischemia   Serial troponin, slightly high.   Due to GI bleed, and co-morbidities- cardio suggest - no invasive work ups.  ASA, if permitted- may start after one week  Cont  nitro.  likely GERD induced chest pain, currently better.  *  Sepsis- likely pneumonia.  Patient has elevated white count. -However her WBC counts at [facility] were also elevated as high as 34.5 on 05/27/2018 and were down to 24.3 at 06/11/2018 -Continue with broad-spectrum antibiotics for now, she is on vancomycin and Zosyn.  blood culture is negative so far.  Procalcitonin is 3.53. -Patient on Decadron as outpatient. - received 5 days Vanc+ zosyn, will give 4 days oral augmentin.  *  Severe esophagitis-on Protonix and also sucralfate  *  Hypokalemia, hypomagnesemia and hypocalcemia-being replaced appropriately.  Improved. -Corrected calcium within normal limits. -IV albumin was given.  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 -Follows with [facility] oncology.  Progressive disease in spite of chemotherapy.  Last chemo at the end of June 2019.  Currently on hold. -Patient very hopeful that she could recover and get back with chemotherapy -Probably unrealistic expectations. Patient is followed by hospice at home.   Per palliative care team, she has spoken with palliative care at [facility] before and was not interested in support from palliative care during this hospitalization.   *  DVT prophylaxis-teds and SCDs  * Constipation.  Started MiraLAX daily per Dr. Bonna Gains.  She had bowel movement.  * Hypophosphatemia.  IV phosphate.  Follow-up level.  * Tachycardia   Start on small dose metoprolol, due to soft BP>  * malnutrition    Have low albumin and as a result low calcium ( corrected Ca Is higher than actual value) .  Overall poor prognosis, but she understands that, does nto want to address code status now.     * generalized weakness- lives with family, walks with support, feels at baseline- d/c today. Home health on discharge.  DISCHARGE CONDITIONS:   Stable.  CONSULTS OBTAINED:  Treatment Team:  Dionisio David, MD  DRUG ALLERGIES:   Allergies  Allergen Reactions   . Cefepime Other (See Comments)    DISCHARGE MEDICATIONS:   Allergies as of 07/04/2018      Reactions   Cefepime Other (See Comments)      Medication List    STOP taking these medications   metFORMIN 500 MG tablet Commonly known as:  GLUCOPHAGE   pantoprazole sodium 40 mg/20 mL Pack Commonly known as:  PROTONIX Replaced by:  pantoprazole 40 MG tablet     TAKE these medications   amoxicillin-clavulanate 875-125 MG tablet Commonly known as:  AUGMENTIN Take 1 tablet by mouth 2 (two) times daily for 4 days.   CARAFATE 1 GM/10ML suspension Generic drug:  sucralfate Take 10 mLs by mouth every 6 (six) hours.   dexamethasone 2 MG tablet Commonly known as:  DECADRON Take 2 mg by mouth 2 (two) times daily with a meal.   docusate sodium 100 MG capsule Commonly known as:  COLACE Take 1 capsule (100 mg total) by mouth 2 (two) times daily.   feeding supplement (ENSURE ENLIVE) Liqd Take 237 mLs by mouth 3 (three) times daily between meals.   HYDROcodone-acetaminophen 5-325 MG tablet Commonly known as:  NORCO/VICODIN Take 1-2 tablets by mouth every 6 (six) hours as needed for moderate pain or severe pain.   lactobacillus Pack Take 1 packet (1 g total) by mouth 3 (three) times daily with meals.   magnesium hydroxide 400 MG/5ML suspension Commonly known as:  MILK OF MAGNESIA Take 30 mLs by mouth daily.   metoprolol tartrate 25 MG tablet Commonly known as:  LOPRESSOR Take 0.5 tablets (12.5 mg total) by mouth 2 (two) times daily. What changed:  how much to take   multivitamin-lutein Caps capsule Take 1 capsule by mouth daily. Start taking on:  07/05/2018   nystatin 100000 UNIT/ML suspension Commonly known as:  MYCOSTATIN Take 5 mLs by mouth 2 (two) times daily.   pantoprazole 40 MG tablet Commonly known as:  PROTONIX Take 1 tablet (40 mg total) by mouth 2 (two) times daily before a meal. Replaces:  pantoprazole sodium 40 mg/20 mL Pack   polycarbophil 625 MG  tablet Commonly known as:  FIBERCON Take 1 tablet (625 mg total) by mouth daily. Start taking on:  07/05/2018   polyethylene glycol packet Commonly known as:  MIRALAX / GLYCOLAX Take 17 g by mouth daily. Start taking on:  07/05/2018   potassium chloride 2 mEq/mL Soln oral liquid Commonly known as:  KCl Take 5 mLs by mouth daily.        DISCHARGE INSTRUCTIONS:    Follow with cardiology clinic in 1 week.  If you experience worsening of your admission symptoms, develop shortness of breath, life threatening emergency, suicidal or homicidal thoughts you must seek medical attention immediately by calling 911 or calling your MD immediately  if symptoms less severe.  You Must read complete instructions/literature along with all the possible adverse reactions/side effects for all the Medicines you take and that have been prescribed to you. Take any new Medicines after you have completely understood and accept all the possible adverse reactions/side effects.   Please note  You were cared for by  a hospitalist during your hospital stay. If you have any questions about your discharge medications or the care you received while you were in the hospital after you are discharged, you can call the unit and asked to speak with the hospitalist on call if the hospitalist that took care of you is not available. Once you are discharged, your primary care physician will handle any further medical issues. Please note that NO REFILLS for any discharge medications will be authorized once you are discharged, as it is imperative that you return to your primary care physician (or establish a relationship with a primary care physician if you do not have one) for your aftercare needs so that they can reassess your need for medications and monitor your lab values.   Today   CHIEF COMPLAINT:   Chief Complaint  Patient presents with  . Chest Pain    HISTORY OF PRESENT ILLNESS:  Alice Clark  is a 52 y.o. female  with a known history of inoperable/incurable ovarian cancer-no longer on chemo/radiation, on home palliative care, presented to the emergency room with chest pain last night, black stools, frequent loose stools, in route to the emergency room patient was given aspirin and nitroglycerin, patient also with complaints with generalized weakness, in the emergency room patient was febrile, tachycardic, tachypneic, hypotensive, white count of 38,000, hemoglobin 7.1, albumin 1.4, total bili 1.8, EKG noted with sinus tachycardia with heart rate of 136, potassium 2.7, sodium 139 4, chloride 97, patient evaluated in the emergency room, husband/daughter at the bedside, patient wants to continue palliative care, patient is considering comfort care management with hospice-requests discussion with her son prior to making this decision.  VITAL SIGNS:  Blood pressure 92/65, pulse (!) 102, temperature 97.7 F (36.5 C), temperature source Oral, resp. rate 18, height 5\' 4"  (1.626 m), weight 59 kg, SpO2 100 %.  I/O:    Intake/Output Summary (Last 24 hours) at 07/04/2018 1330 Last data filed at 07/03/2018 1414 Gross per 24 hour  Intake 362.07 ml  Output -  Net 362.07 ml    PHYSICAL EXAMINATION:   GENERAL:  52 y.o.-year-old frail-appearing patient lying in the bed with no acute distress.  EYES: Pupils equal, round, reactive to light and accommodation. No scleral icterus. Extraocular muscles intact.  HEENT: Head atraumatic, normocephalic. Oropharynx and nasopharynx clear.  NECK:  Supple, no jugular venous distention. No thyroid enlargement, no tenderness.  LUNGS: Normal breath sounds bilaterally, no wheezing, rales,rhonchi or crepitation. No use of accessory muscles of respiration.  Decreased bibasilar breath sounds. CARDIOVASCULAR: S1, S2 fast, regular. No murmurs, rubs, or gallops.  ABDOMEN: Soft, no abdomen tenderness or distention, no rigidity or guarding. Bowel sounds present. No organomegaly or mass.   EXTREMITIES: No pedal edema, cyanosis, or clubbing.  NEUROLOGIC: Cranial nerves II through XII are intact. Muscle strength 3/5 in all extremities. Sensation intact. Gait not checked.  Global weakness noted PSYCHIATRIC: The patient is alert and oriented x 3.  SKIN: No obvious rash, lesion, or ulcer.   DATA REVIEW:   CBC Recent Labs  Lab 07/04/18 0531  WBC 43.8*  HGB 8.5*  HCT 25.9*  PLT 161    Chemistries  Recent Labs  Lab 07/02/18 0320  07/04/18 0531  NA 137   < > 134*  K 4.5   < > 4.0  CL 109   < > 106  CO2 19*   < > 22  GLUCOSE 155*   < > 122*  BUN 10   < > 10  CREATININE 0.49   < > 0.43*  CALCIUM 5.4*   < > 5.3*  MG  --    < > 1.9  AST 21  --   --   ALT 11  --   --   ALKPHOS 200*  --   --   BILITOT 1.9*  --   --    < > = values in this interval not displayed.    Cardiac Enzymes Recent Labs  Lab 07/03/18 1908  TROPONINI 0.03*    Microbiology Results  Results for orders placed or performed during the hospital encounter of 06/29/18  Urine culture     Status: None   Collection Time: 06/29/18 10:26 AM  Result Value Ref Range Status   Specimen Description   Final    URINE, RANDOM Performed at [facility], [address], [place], [place] [address]    Special Requests   Final    NONE Performed at [facility], [address] [place], [place] [address]    Culture   Final    NO GROWTH Performed at [facility], [place] [address] [place], [place] [address]    Report Status 06/30/2018 FINAL  Final  Culture, blood (routine x 2)     Status: None   Collection Time: 06/29/18 10:59 AM  Result Value Ref Range Status   Specimen Description BLOOD PORTA CATH  Final   Special Requests   Final    BOTTLES DRAWN AEROBIC AND ANAEROBIC Blood Culture results may not be optimal due to an excessive volume of blood received in culture bottles   Culture   Final    NO GROWTH 5 DAYS Performed at [facility], [address],  [place], [place] [address]    Report Status 07/04/2018 FINAL  Final  Culture, blood (x 2)     Status: None   Collection Time: 06/29/18  3:28 PM  Result Value Ref Range Status   Specimen Description BLOOD BLOOD RIGHT HAND  Final   Special Requests   Final    BOTTLES DRAWN AEROBIC AND ANAEROBIC Blood Culture adequate volume   Culture   Final    NO GROWTH 5 DAYS Performed at [facility], [address] [place], [place] [address]    Report Status 07/04/2018 FINAL  Final  Culture, blood (x 2)     Status: None   Collection Time: 06/29/18  3:38 PM  Result Value Ref Range Status   Specimen Description BLOOD BLOOD LEFT HAND  Final   Special Requests   Final    BOTTLES DRAWN AEROBIC AND ANAEROBIC Blood Culture adequate volume   Culture   Final    NO GROWTH 5 DAYS Performed at [facility], [address], [place], [place] [address]    Report Status 07/04/2018 FINAL  Final    RADIOLOGY:  No results found.  EKG:   Orders placed or performed during the hospital encounter of 06/29/18  . EKG 12-Lead  . EKG 12-Lead  . EKG 12-Lead  . EKG 12-Lead  . ED EKG  . ED EKG  . EKG 12-Lead  . EKG 12-Lead  . EKG 12-Lead  . EKG 12-Lead  . EKG 12-Lead  . EKG 12-Lead  . EKG 12-Lead  . EKG 12-Lead      Management plans discussed with the patient, family and they are in agreement.  CODE STATUS: full.    Code Status Orders  (From admission, onward)  Start     Ordered   06/29/18 1438  Full code  Continuous     06/29/18 1437        Code Status History    Date Active Date Inactive Code Status Order ID Comments User Context   06/29/2018 1414 06/29/2018 1437 DNR [id number]  Salary, Avel Peace, MD ED    Advance Directive Documentation     Most Recent Value  Type of Advance Directive  Healthcare Power of Attorney  Pre-existing out of facility DNR order (yellow form or pink MOST form)  -  "MOST" Form in Place?  -      TOTAL TIME TAKING CARE OF THIS PATIENT: 35  minutes.    Vaughan Basta M.D on 07/04/2018 at 1:30 PM  Between 7am to 6pm - Pager - [phone]  After 6pm go to [email or web address] - password [email or web address] [place] [facility]  Office  [phone]  CC: Primary care physician; Alice Coffin, MD   Note: This dictation was prepared with Dragon dictation along with smaller phrase technology. Any transcriptional errors that result from this process are unintentional.

## 2018-07-04 NOTE — Progress Notes (Signed)
PT Cancellation Note  Patient Details Name: Alice Clark MRN: [id number] DOB: [birth year]   Cancelled Treatment:    Reason Eval/Treat Not Completed: Medical issues which prohibited therapy Pt with critically low Ca+ levels (5.3 this AM) along with other abnormal lab values, low BP, etc.  Will attempt PT when lab values have improve to safe levels; PT held this date.  Kreg Shropshire, DPT 07/04/2018, 11:28 AM

## 2018-07-04 NOTE — Progress Notes (Signed)
Follow up on current hospice patient who remains in the hospital.  Patient is hypotensive.  Unable to participate in PT today due to weakness and critical lab values.  Patient has had several bowel movements and bloating/constipation is better.    No plans for discharge. Will continue to follow.

## 2018-07-16 ENCOUNTER — Emergency Department

## 2018-07-16 ENCOUNTER — Inpatient Hospital Stay

## 2018-07-16 ENCOUNTER — Inpatient Hospital Stay
Admission: EM | Admit: 2018-07-16 | Discharge: 2018-07-27 | DRG: 871 | Disposition: E | Attending: Internal Medicine | Admitting: Internal Medicine

## 2018-07-16 ENCOUNTER — Encounter: Payer: Self-pay | Admitting: Internal Medicine

## 2018-07-16 DIAGNOSIS — D689 Coagulation defect, unspecified: Secondary | ICD-10-CM | POA: Diagnosis present

## 2018-07-16 DIAGNOSIS — D6959 Other secondary thrombocytopenia: Secondary | ICD-10-CM | POA: Diagnosis present

## 2018-07-16 DIAGNOSIS — I63512 Cerebral infarction due to unspecified occlusion or stenosis of left middle cerebral artery: Secondary | ICD-10-CM

## 2018-07-16 DIAGNOSIS — K922 Gastrointestinal hemorrhage, unspecified: Secondary | ICD-10-CM | POA: Diagnosis present

## 2018-07-16 DIAGNOSIS — Z881 Allergy status to other antibiotic agents status: Secondary | ICD-10-CM

## 2018-07-16 DIAGNOSIS — J189 Pneumonia, unspecified organism: Secondary | ICD-10-CM | POA: Diagnosis present

## 2018-07-16 DIAGNOSIS — R64 Cachexia: Secondary | ICD-10-CM | POA: Diagnosis present

## 2018-07-16 DIAGNOSIS — E878 Other disorders of electrolyte and fluid balance, not elsewhere classified: Secondary | ICD-10-CM | POA: Diagnosis present

## 2018-07-16 DIAGNOSIS — E872 Acidosis: Secondary | ICD-10-CM | POA: Diagnosis present

## 2018-07-16 DIAGNOSIS — I639 Cerebral infarction, unspecified: Secondary | ICD-10-CM | POA: Diagnosis present

## 2018-07-16 DIAGNOSIS — R6521 Severe sepsis with septic shock: Secondary | ICD-10-CM | POA: Diagnosis present

## 2018-07-16 DIAGNOSIS — E119 Type 2 diabetes mellitus without complications: Secondary | ICD-10-CM | POA: Diagnosis present

## 2018-07-16 DIAGNOSIS — E43 Unspecified severe protein-calorie malnutrition: Secondary | ICD-10-CM | POA: Diagnosis present

## 2018-07-16 DIAGNOSIS — R740 Nonspecific elevation of levels of transaminase and lactic acid dehydrogenase [LDH]: Secondary | ICD-10-CM | POA: Diagnosis present

## 2018-07-16 DIAGNOSIS — G9340 Encephalopathy, unspecified: Secondary | ICD-10-CM | POA: Diagnosis present

## 2018-07-16 DIAGNOSIS — C7989 Secondary malignant neoplasm of other specified sites: Secondary | ICD-10-CM | POA: Diagnosis present

## 2018-07-16 DIAGNOSIS — D500 Iron deficiency anemia secondary to blood loss (chronic): Secondary | ICD-10-CM | POA: Diagnosis present

## 2018-07-16 DIAGNOSIS — J9601 Acute respiratory failure with hypoxia: Secondary | ICD-10-CM | POA: Diagnosis present

## 2018-07-16 DIAGNOSIS — J90 Pleural effusion, not elsewhere classified: Secondary | ICD-10-CM | POA: Diagnosis present

## 2018-07-16 DIAGNOSIS — E46 Unspecified protein-calorie malnutrition: Secondary | ICD-10-CM | POA: Diagnosis present

## 2018-07-16 DIAGNOSIS — Z90710 Acquired absence of both cervix and uterus: Secondary | ICD-10-CM

## 2018-07-16 DIAGNOSIS — Z6824 Body mass index (BMI) 24.0-24.9, adult: Secondary | ICD-10-CM

## 2018-07-16 DIAGNOSIS — D649 Anemia, unspecified: Secondary | ICD-10-CM

## 2018-07-16 DIAGNOSIS — D696 Thrombocytopenia, unspecified: Secondary | ICD-10-CM

## 2018-07-16 DIAGNOSIS — A419 Sepsis, unspecified organism: Secondary | ICD-10-CM | POA: Diagnosis present

## 2018-07-16 DIAGNOSIS — J9811 Atelectasis: Secondary | ICD-10-CM | POA: Diagnosis present

## 2018-07-16 DIAGNOSIS — Z66 Do not resuscitate: Secondary | ICD-10-CM | POA: Diagnosis present

## 2018-07-16 DIAGNOSIS — C569 Malignant neoplasm of unspecified ovary: Secondary | ICD-10-CM | POA: Diagnosis present

## 2018-07-16 DIAGNOSIS — E876 Hypokalemia: Secondary | ICD-10-CM | POA: Diagnosis present

## 2018-07-16 DIAGNOSIS — Z515 Encounter for palliative care: Secondary | ICD-10-CM | POA: Diagnosis present

## 2018-07-16 DIAGNOSIS — Z79899 Other long term (current) drug therapy: Secondary | ICD-10-CM

## 2018-07-16 DIAGNOSIS — Z7952 Long term (current) use of systemic steroids: Secondary | ICD-10-CM

## 2018-07-16 DIAGNOSIS — R4182 Altered mental status, unspecified: Secondary | ICD-10-CM | POA: Diagnosis present

## 2018-07-16 LAB — BLOOD GAS, VENOUS
Acid-base deficit: 11.6 mmol/L — ABNORMAL HIGH (ref 0.0–2.0)
BICARBONATE: 15.6 mmol/L — AB (ref 20.0–28.0)
FIO2: 0.4
MECHVT: 400 mL
Mechanical Rate: 18
O2 Saturation: 82.8 %
PATIENT TEMPERATURE: 37
PEEP: 5 cmH2O
PH VEN: 7.21 — AB (ref 7.250–7.430)
pCO2, Ven: 39 mmHg — ABNORMAL LOW (ref 44.0–60.0)
pO2, Ven: 58 mmHg — ABNORMAL HIGH (ref 32.0–45.0)

## 2018-07-16 LAB — TYPE AND SCREEN
ABO/RH(D): O POS
ANTIBODY SCREEN: NEGATIVE

## 2018-07-16 LAB — COMPREHENSIVE METABOLIC PANEL
ALT: 64 U/L — AB (ref 0–44)
AST: 389 U/L — AB (ref 15–41)
Alkaline Phosphatase: 315 U/L — ABNORMAL HIGH (ref 38–126)
Anion gap: 8 (ref 5–15)
BUN: 23 mg/dL — AB (ref 6–20)
CHLORIDE: 113 mmol/L — AB (ref 98–111)
CO2: 17 mmol/L — AB (ref 22–32)
Calcium: 4.8 mg/dL — CL (ref 8.9–10.3)
Creatinine, Ser: 0.88 mg/dL (ref 0.44–1.00)
GFR calc Af Amer: >60 mL/min (ref >60)
GFR calc non Af Amer: >60 mL/min (ref >60)
GLUCOSE: 70 mg/dL (ref 70–99)
POTASSIUM: 3.2 mmol/L — AB (ref 3.5–5.1)
SODIUM: 138 mmol/L (ref 135–145)
Total Bilirubin: 2 mg/dL — ABNORMAL HIGH (ref 0.3–1.2)
Total Protein: 4 g/dL — ABNORMAL LOW (ref 6.5–8.1)

## 2018-07-16 LAB — BLOOD CULTURE ID PANEL (REFLEXED)
Acinetobacter baumannii: NOT DETECTED
CANDIDA KRUSEI: NOT DETECTED
CANDIDA PARAPSILOSIS: NOT DETECTED
Candida albicans: NOT DETECTED
Candida glabrata: NOT DETECTED
Candida tropicalis: NOT DETECTED
ESCHERICHIA COLI: NOT DETECTED
Enterobacter cloacae complex: NOT DETECTED
Enterobacteriaceae species: NOT DETECTED
Enterococcus species: NOT DETECTED
HAEMOPHILUS INFLUENZAE: NOT DETECTED
KLEBSIELLA OXYTOCA: NOT DETECTED
Klebsiella pneumoniae: NOT DETECTED
Listeria monocytogenes: NOT DETECTED
Neisseria meningitidis: NOT DETECTED
PROTEUS SPECIES: NOT DETECTED
PSEUDOMONAS AERUGINOSA: NOT DETECTED
SERRATIA MARCESCENS: NOT DETECTED
STAPHYLOCOCCUS AUREUS BCID: NOT DETECTED
STAPHYLOCOCCUS SPECIES: NOT DETECTED
Streptococcus agalactiae: NOT DETECTED
Streptococcus pneumoniae: NOT DETECTED
Streptococcus pyogenes: NOT DETECTED
Streptococcus species: NOT DETECTED

## 2018-07-16 LAB — BRAIN NATRIURETIC PEPTIDE: B NATRIURETIC PEPTIDE 5: 126 pg/mL — AB (ref 0.0–100.0)

## 2018-07-16 LAB — CBC WITH DIFFERENTIAL/PLATELET
BASOS ABS: 0 10*3/uL (ref 0–0.1)
BASOS PCT: 0 %
Band Neutrophils: 9 %
Blasts: 0 %
EOS PCT: 0 %
Eosinophils Absolute: 0 10*3/uL (ref 0–0.7)
HCT: 15.1 % — ABNORMAL LOW (ref 35.0–47.0)
Hemoglobin: 4.8 g/dL — CL (ref 12.0–16.0)
LYMPHS ABS: 0.1 10*3/uL — AB (ref 1.0–3.6)
Lymphocytes Relative: 2 %
MCH: 30.1 pg (ref 26.0–34.0)
MCHC: 32 g/dL (ref 32.0–36.0)
MCV: 94.2 fL (ref 80.0–100.0)
MONO ABS: 0 10*3/uL — AB (ref 0.2–0.9)
MYELOCYTES: 1 %
Metamyelocytes Relative: 2 %
Monocytes Relative: 0 %
NEUTROS PCT: 86 %
Neutro Abs: 7 10*3/uL — ABNORMAL HIGH (ref 1.4–6.5)
Other: 0 %
PLATELETS: 53 10*3/uL — AB (ref 150–440)
Promyelocytes Relative: 0 %
RBC: 1.6 MIL/uL — ABNORMAL LOW (ref 3.80–5.20)
RDW: 18.6 % — AB (ref 11.5–14.5)
WBC: 7.1 10*3/uL (ref 3.6–11.0)
nRBC: 0 /100 WBC

## 2018-07-16 LAB — MAGNESIUM: MAGNESIUM: 1.2 mg/dL — AB (ref 1.7–2.4)

## 2018-07-16 LAB — CK: Total CK: 20 U/L — ABNORMAL LOW (ref 38–234)

## 2018-07-16 LAB — PROTIME-INR
INR: 3.2
Prothrombin Time: 32.5 seconds — ABNORMAL HIGH (ref 11.4–15.2)

## 2018-07-16 LAB — URINALYSIS, COMPLETE (UACMP) WITH MICROSCOPIC
GLUCOSE, UA: NEGATIVE mg/dL
KETONES UR: NEGATIVE mg/dL
Nitrite: NEGATIVE
PH: 5 (ref 5.0–8.0)
PROTEIN: 100 mg/dL — AB
Specific Gravity, Urine: 1.016 (ref 1.005–1.030)

## 2018-07-16 LAB — GLUCOSE, CAPILLARY: GLUCOSE-CAPILLARY: 84 mg/dL (ref 70–99)

## 2018-07-16 LAB — PHOSPHORUS: Phosphorus: 2.9 mg/dL (ref 2.5–4.6)

## 2018-07-16 LAB — PROCALCITONIN: Procalcitonin: 19.34 ng/mL

## 2018-07-16 LAB — LACTIC ACID, PLASMA: LACTIC ACID, VENOUS: 5.1 mmol/L — AB (ref 0.5–1.9)

## 2018-07-16 LAB — PREALBUMIN

## 2018-07-16 LAB — MRSA PCR SCREENING: MRSA BY PCR: POSITIVE — AB

## 2018-07-16 MED ORDER — ONDANSETRON HCL 4 MG/2ML IJ SOLN: 4.0000 mg | Freq: Four times a day (QID) | INTRAMUSCULAR | Status: DC | PRN

## 2018-07-16 MED ORDER — SODIUM CHLORIDE 0.9 % IV SOLN: 250.0000 mL | INTRAVENOUS | Status: DC | PRN

## 2018-07-16 MED ORDER — FENTANYL CITRATE (PF) 100 MCG/2ML IJ SOLN: 25.0000 ug | INTRAMUSCULAR | Status: DC | PRN

## 2018-07-16 MED ORDER — SODIUM CHLORIDE 0.9 % IV SOLN
2.0000 g | Freq: Once | INTRAVENOUS | Status: AC
  Administered 2018-07-16: 2 g via INTRAVENOUS
  Filled 2018-07-16: qty 2

## 2018-07-16 MED ORDER — CHLORHEXIDINE GLUCONATE CLOTH 2 % EX PADS: 6.0000 | MEDICATED_PAD | Freq: Every day | CUTANEOUS | Status: DC

## 2018-07-16 MED ORDER — SODIUM BICARBONATE 8.4 % IV SOLN
100.0000 meq | Freq: Once | INTRAVENOUS | Status: AC
  Administered 2018-07-16: 100 meq via INTRAVENOUS
  Filled 2018-07-16: qty 50

## 2018-07-16 MED ORDER — MORPHINE SULFATE (PF) 4 MG/ML IV SOLN
INTRAVENOUS | Status: AC
  Filled 2018-07-16: qty 1

## 2018-07-16 MED ORDER — SENNOSIDES-DOCUSATE SODIUM 8.6-50 MG PO TABS: 1.0000 | ORAL_TABLET | Freq: Every evening | ORAL | Status: DC | PRN

## 2018-07-16 MED ORDER — VANCOMYCIN HCL IN DEXTROSE 1-5 GM/200ML-% IV SOLN
1000.0000 mg | Freq: Once | INTRAVENOUS | Status: AC
  Administered 2018-07-16: 1000 mg via INTRAVENOUS
  Filled 2018-07-16: qty 200

## 2018-07-16 MED ORDER — MORPHINE SULFATE (PF) 2 MG/ML IV SOLN: 1.0000 mg | INTRAVENOUS | Status: DC | PRN

## 2018-07-16 MED ORDER — INSULIN ASPART 100 UNIT/ML ~~LOC~~ SOLN: 0.0000 [IU] | Freq: Every day | SUBCUTANEOUS | Status: DC

## 2018-07-16 MED ORDER — ENOXAPARIN SODIUM 40 MG/0.4ML ~~LOC~~ SOLN: 40.0000 mg | SUBCUTANEOUS | Status: DC

## 2018-07-16 MED ORDER — PROPOFOL 1000 MG/100ML IV EMUL
5.0000 ug/kg/min | INTRAVENOUS | Status: DC
  Administered 2018-07-16: 15 ug/kg/min via INTRAVENOUS
  Administered 2018-07-16: 5 ug/kg/min via INTRAVENOUS
  Filled 2018-07-16: qty 100

## 2018-07-16 MED ORDER — LORAZEPAM 2 MG/ML IJ SOLN: 1.0000 mg | INTRAMUSCULAR | Status: DC | PRN

## 2018-07-16 MED ORDER — SUCCINYLCHOLINE CHLORIDE 20 MG/ML IJ SOLN
120.0000 mg | Freq: Once | INTRAMUSCULAR | Status: AC
  Administered 2018-07-16: 120 mg via INTRAVENOUS

## 2018-07-16 MED ORDER — SODIUM CHLORIDE 0.9 % IV SOLN
2.0000 g | Freq: Three times a day (TID) | INTRAVENOUS | Status: DC
  Filled 2018-07-16 (×4): qty 2

## 2018-07-16 MED ORDER — SODIUM CHLORIDE 0.9 % IV BOLUS (SEPSIS)
1000.0000 mL | Freq: Once | INTRAVENOUS | Status: AC
  Administered 2018-07-16: 1000 mL via INTRAVENOUS

## 2018-07-16 MED ORDER — MORPHINE 100MG IN NS 100ML (1MG/ML) PREMIX INFUSION
10.0000 mg/h | INTRAVENOUS | Status: DC
  Administered 2018-07-17: 2 mg/h via INTRAVENOUS
  Filled 2018-07-16: qty 100

## 2018-07-16 MED ORDER — SUCRALFATE 1 GM/10ML PO SUSP: 1.0000 g | Freq: Four times a day (QID) | ORAL | Status: DC

## 2018-07-16 MED ORDER — BISACODYL 10 MG RE SUPP: 10.0000 mg | Freq: Every day | RECTAL | Status: DC | PRN

## 2018-07-16 MED ORDER — FENTANYL CITRATE (PF) 100 MCG/2ML IJ SOLN
25.0000 ug | Freq: Once | INTRAMUSCULAR | Status: AC
  Administered 2018-07-16: 25 ug via INTRAVENOUS

## 2018-07-16 MED ORDER — BISACODYL 5 MG PO TBEC: 5.0000 mg | DELAYED_RELEASE_TABLET | Freq: Every day | ORAL | Status: DC | PRN

## 2018-07-16 MED ORDER — MORPHINE SULFATE (PF) 2 MG/ML IV SOLN
2.0000 mg | INTRAVENOUS | Status: DC | PRN
  Administered 2018-07-16: 4 mg via INTRAVENOUS

## 2018-07-16 MED ORDER — VANCOMYCIN HCL IN DEXTROSE 750-5 MG/150ML-% IV SOLN
750.0000 mg | Freq: Two times a day (BID) | INTRAVENOUS | Status: DC
  Filled 2018-07-16 (×3): qty 150

## 2018-07-16 MED ORDER — PANTOPRAZOLE SODIUM 40 MG IV SOLR: 40.0000 mg | Freq: Two times a day (BID) | INTRAVENOUS | Status: DC

## 2018-07-16 MED ORDER — METRONIDAZOLE IN NACL 5-0.79 MG/ML-% IV SOLN
500.0000 mg | Freq: Three times a day (TID) | INTRAVENOUS | Status: DC
  Administered 2018-07-16: 500 mg via INTRAVENOUS
  Filled 2018-07-16 (×5): qty 100

## 2018-07-16 MED ORDER — NALOXONE HCL 2 MG/2ML IJ SOSY
1.0000 mg | PREFILLED_SYRINGE | Freq: Once | INTRAMUSCULAR | Status: AC
  Administered 2018-07-16: 1 mg via INTRAVENOUS

## 2018-07-16 MED ORDER — HYDROCORTISONE NA SUCCINATE PF 100 MG IJ SOLR: 100.0000 mg | Freq: Once | INTRAMUSCULAR | Status: DC

## 2018-07-16 MED ORDER — MAGNESIUM SULFATE 4 GM/100ML IV SOLN
4.0000 g | Freq: Once | INTRAVENOUS | Status: DC
  Filled 2018-07-16: qty 100

## 2018-07-16 MED ORDER — POTASSIUM CHLORIDE 10 MEQ/100ML IV SOLN
10.0000 meq | INTRAVENOUS | Status: AC
  Administered 2018-07-16: 10 meq via INTRAVENOUS
  Filled 2018-07-16 (×4): qty 100

## 2018-07-16 MED ORDER — NOREPINEPHRINE 4 MG/250ML-% IV SOLN
0.0000 ug/min | Freq: Once | INTRAVENOUS | Status: AC
  Administered 2018-07-16: 2.667 ug/min via INTRAVENOUS

## 2018-07-16 MED ORDER — MUPIROCIN 2 % EX OINT
1.0000 "application " | TOPICAL_OINTMENT | Freq: Two times a day (BID) | CUTANEOUS | Status: DC
  Filled 2018-07-16: qty 22

## 2018-07-16 MED ORDER — INSULIN ASPART 100 UNIT/ML ~~LOC~~ SOLN: 0.0000 [IU] | Freq: Three times a day (TID) | SUBCUTANEOUS | Status: DC

## 2018-07-16 MED ORDER — NOREPINEPHRINE 4 MG/250ML-% IV SOLN
INTRAVENOUS | Status: AC
  Administered 2018-07-16: 2.667 ug/min via INTRAVENOUS
  Filled 2018-07-16: qty 250

## 2018-07-16 MED ORDER — MAGNESIUM SULFATE 2 GM/50ML IV SOLN: 2.0000 g | Freq: Once | INTRAVENOUS | Status: DC

## 2018-07-16 MED ORDER — FENTANYL 2500MCG IN NS 250ML (10MCG/ML) PREMIX INFUSION: 0.0000 ug/h | INTRAVENOUS | Status: DC

## 2018-07-16 NOTE — Progress Notes (Signed)
CODE SEPSIS - PHARMACY COMMUNICATION  **Broad Spectrum Antibiotics should be administered within 1 hour of Sepsis diagnosis**  Time Code Sepsis Called/Page Received: 8063 0129  Antibiotics Ordered: 0821 0130  Time of 1st antibiotic administration: 0821 0207  Additional action taken by pharmacy:   If necessary, Name of Provider/Nurse Contacted:     Eloise Harman ,PharmD Clinical Pharmacist  06/26/2018  2:33 AM

## 2018-07-16 NOTE — ED Notes (Signed)
Patient transported to [facility] with Seth Bake, RN, RT, [place], EDT. Pt transported to ICU from CT.

## 2018-07-16 NOTE — Progress Notes (Signed)
Spoke to [place], Therapist, sports on 1C regarding the transfer of this patient to room 122.  The family is aware and in agreement. A Morphine drip was sent from pharmacy but is currently not in use because the patient is still comfortable. The drip will be sent down with the patient upon transfer.   Cameron Ali, RN

## 2018-07-16 NOTE — Progress Notes (Signed)
Contacted [facility] regarding initiation of comfort care orders and potential transfer orders to 1C or any other med-surg unit. The individual on the phone stated that she would speak to the MD regarding these orders.    Cameron Ali, RN

## 2018-07-16 NOTE — ED Provider Notes (Signed)
[facility] Emergency Department Provider Note    First MD Initiated Contact with Patient 06/26/2018 [phone]     (approximate)  I have reviewed the triage vital signs and the nursing notes.  Level 5 caveat history and physical exam limited secondary to altered mental status HISTORY  Chief Complaint Altered Mental Status   HPI Alice Clark is a 52 y.o. female with below list of chronic medical conditions including ovarian cancer no longer receiving hemotherapy or radiation.  Currently under hospice care presents to the emergency department secondary to altered mental status and respiratory distress.  Per EMS on their arrival the patient with apparent respiratory difficulty and obtunded.  Patient noted to be tachycardic and hypotensive with pressure of 60/26.   Past Medical History:  Diagnosis Date  . Diabetes mellitus without complication ([place])   . Diverticulitis     Patient Active Problem List   Diagnosis Date Noted  . Acute hypoxemic respiratory failure ([place]) 07/06/2018  . Protein-calorie malnutrition, severe 07/01/2018  . 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 Pressure injury of skin 06/30/2018  . Melena 06/29/2018    Past Surgical History:  Procedure Laterality Date  . ABDOMINAL HYSTERECTOMY    . APPENDECTOMY    . CESAREAN SECTION    . TOTAL ABDOMINAL HYSTERECTOMY      Prior to Admission medications   Medication Sig Start Date End Date Taking? Authorizing Provider  dexamethasone (DECADRON) 2 MG tablet Take 2 mg by mouth 2 (two) times daily with a meal. 05/16/18   [provider]  docusate sodium (COLACE) 100 MG capsule Take 1 capsule (100 mg total) by mouth 2 (two) times daily. 07/04/18   Vaughan Basta, MD  feeding supplement, ENSURE ENLIVE, (ENSURE ENLIVE) LIQD Take 237 mLs by mouth 3 (three) times daily between meals. 07/04/18   Vaughan Basta, MD  HYDROcodone-acetaminophen  (NORCO/VICODIN) 5-325 MG tablet Take 1-2 tablets by mouth every 6 (six) hours as needed for moderate pain or severe pain. 07/04/18   Vaughan Basta, MD  lactobacillus (FLORANEX/LACTINEX) PACK Take 1 packet (1 g total) by mouth 3 (three) times daily with meals. 07/04/18   Vaughan Basta, MD  metoprolol tartrate (LOPRESSOR) 25 MG tablet Take 0.5 tablets (12.5 mg total) by mouth 2 (two) times daily. 07/04/18   Vaughan Basta, MD  multivitamin-lutein [facility]) CAPS capsule Take 1 capsule by mouth daily. 07/05/18   Vaughan Basta, MD  nystatin (MYCOSTATIN) 100000 UNIT/ML suspension Take 5 mLs by mouth 2 (two) times daily.    [provider]  pantoprazole (PROTONIX) 40 MG tablet Take 1 tablet (40 mg total) by mouth 2 (two) times daily before a meal. 07/04/18   Vaughan Basta, MD  polycarbophil (FIBERCON) 625 MG tablet Take 1 tablet (625 mg total) by mouth daily. 07/05/18   Vaughan Basta, MD  polyethylene glycol (MIRALAX / GLYCOLAX) packet Take 17 g by mouth daily. 07/05/18   Vaughan Basta, MD  potassium chloride (KCL) 2 mEq/mL SOLN oral liquid Take 5 mLs by mouth daily. 06/12/18   [provider]  sucralfate (CARAFATE) 1 GM/10ML suspension Take 10 mLs by mouth every 6 (six) hours. 05/31/18 06/30/18  [provider]    Allergies Cefepime  Family History  Problem Relation Age of Onset  . Breast cancer Neg Hx     Social History Social History   Tobacco Use  . Smoking status: Never Smoker  . Smokeless tobacco: Never Used  Substance Use Topics  .  Alcohol use: No    Frequency: Never  . Drug use: No    Review of Systems Constitutional: No fever/chills Eyes: No visual changes. ENT: No sore throat. Cardiovascular: Denies chest pain. Respiratory: Positive for respiratory difficulty plugged do Gastrointestinal: No abdominal pain.  No nausea, no vomiting.  No diarrhea.  No constipation. Genitourinary: Negative for  dysuria. Musculoskeletal: Negative for neck pain.  Negative for back pain. Integumentary: Negative for rash. Neurological: Positive for altered mental status   ____________________________________________   PHYSICAL EXAM:  VITAL SIGNS: ED Triage Vitals  Enc Vitals Group     BP 07/04/2018 0124 (!) 63/33     Pulse Rate 07/24/2018 0124 (!) 132     Resp 07/04/2018 0124 20     Temp 07/04/2018 0124 (!) 97.5 F (36.4 C)     Temp src --      SpO2 07/02/2018 0124 100 %     Weight 06/26/2018 0127 58 kg (127 lb 13.9 oz)     Height 07/18/2018 0127 1.626 m (_0 )     Head Circumference --      Peak Flow --      Pain Score --      Pain Loc --      Pain Edu? --      Excl. in [place]? --     Constitutional: Unresponsive to verbal and noxious stimuli. Eyes: Conjunctivae are pale. PERRL Head: Atraumatic. Mouth/Throat: Mucous membranes are dry   Cardiovascular: Tachycardia regular rhythm. Good peripheral circulation. Grossly normal heart sounds. Respiratory: Normal respiratory effort.  No retractions.  Bibasilar rhonchi right more than left Gastrointestinal: Soft and nontender. No distention.  Musculoskeletal: No lower extremity tenderness nor edema. No gross deformities of extremities. Neurologic:  Normal speech and language. No gross focal neurologic deficits are appreciated.  Skin:  Skin is warm, dry and intact, pale. No rash noted.  ____________________________________________   LABS (all labs ordered are listed, but only abnormal results are displayed)  Labs Reviewed  LACTIC ACID, PLASMA - Abnormal; Notable for the following components:      Result Value   Lactic Acid, Venous 5.1 (*)    All other components within normal limits  URINALYSIS, COMPLETE (UACMP) WITH MICROSCOPIC - Abnormal; Notable for the following components:   Color, Urine AMBER (*)    APPearance CLOUDY (*)    Hgb urine dipstick MODERATE (*)    Bilirubin Urine SMALL (*)    Protein, ur 100 (*)    Leukocytes, UA MODERATE (*)      All other components within normal limits  CBC WITH DIFFERENTIAL/PLATELET - Abnormal; Notable for the following components:   RBC 1.60 (*)    Hemoglobin 4.8 (*)    HCT 15.1 (*)    RDW 18.6 (*)    Platelets 53 (*)    Neutro Abs 7.0 (*)    Lymphs Abs 0.1 (*)    Monocytes Absolute 0.0 (*)    All other components within normal limits  COMPREHENSIVE METABOLIC PANEL - Abnormal; Notable for the following components:   Potassium 3.2 (*)    Chloride 113 (*)    CO2 17 (*)    BUN 23 (*)    Calcium 4.8 (*)    Total Protein 4.0 (*)    Albumin <1.0 (*)    AST 389 (*)    ALT 64 (*)    Alkaline Phosphatase 315 (*)    Total Bilirubin 2.0 (*)    All other components within normal limits  BLOOD GAS,  VENOUS - Abnormal; Notable for the following components:   pH, Ven 7.21 (*)    pCO2, Ven 39 (*)    pO2, Ven 58.0 (*)    Bicarbonate 15.6 (*)    Acid-base deficit 11.6 (*)    All other components within normal limits  PROTIME-INR - Abnormal; Notable for the following components:   Prothrombin Time 32.5 (*)    All other components within normal limits  BRAIN NATRIURETIC PEPTIDE - Abnormal; Notable for the following components:   B Natriuretic Peptide 126.0 (*)    All other components within normal limits  MAGNESIUM - Abnormal; Notable for the following components:   Magnesium 1.2 (*)    All other components within normal limits  CK - Abnormal; Notable for the following components:   Total CK 20 (*)    All other components within normal limits  CULTURE, BLOOD (ROUTINE X 2)  CULTURE, BLOOD (ROUTINE X 2)  MRSA PCR SCREENING  PROCALCITONIN  PHOSPHORUS  GLUCOSE, CAPILLARY  CBC WITH DIFFERENTIAL/PLATELET  CALCIUM, IONIZED  PREALBUMIN  TRIGLYCERIDES  I-STAT CG4 LACTIC ACID, ED  I-STAT CG4 LACTIC ACID, ED  TYPE AND SCREEN   ____________________________________________  EKG  ED ECG REPORT I, [place] N Amelianna Meller, the attending physician, personally viewed and interpreted this ECG.   Date:  07-24-18  EKG Time: 1:23 AM  Rate: 119  Rhythm: Sinus tachycardia  Axis: Normal  Intervals: Normal  ST&T Change: None  ____________________________________________  RADIOLOGY I, [place] Dreshon Proffit, personally viewed and evaluated these images (plain radiographs) as part of my medical decision making, as well as reviewing the written report by the radiologist.    Official radiology report(s): Ct Head Wo Contrast  Result Date: 06/29/2018 CLINICAL DATA:  Altered level of consciousness. Code sepsis. History of metastatic ovarian cancer. EXAM: CT HEAD WITHOUT CONTRAST TECHNIQUE: Contiguous axial images were obtained from the base of the skull through the vertex without intravenous contrast. COMPARISON:  None. FINDINGS: BRAIN: A wedge-like hypodensity LEFT parietal lobe (sagittal image 37). Patchy supratentorial white matter hypodensities. Moderate parenchymal brain volume loss. No intraparenchymal hemorrhage, mass effect or midline shift. No abnormal extra-axial fluid collections. VASCULAR: Unremarkable. SKULL/SOFT TISSUES: No skull fracture. No significant soft tissue swelling. ORBITS/SINUSES: Mild paranasal sinus mucosal thickening without air-fluid levels. Mastoid air cells are well aerated.Trace paranasal sinus mucosal thickening. Mastoid air cells are well aerated. OTHER: None. IMPRESSION: 1. 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 2. Mild chronic small vessel ischemic changes. 3. Moderate parenchymal brain volume loss, advanced for age. 4. These results will be called to the ordering clinician or representative by the Radiologist Assistant, and communication documented in the PACS or zVision Dashboard. Electronically Signed   By: Elon Alas M.D.   On: 07/15/2018 05:09   Dg Chest Port 1 View  Result Date: 07/01/2018 CLINICAL DATA:  Respiratory failure, post intubation EXAM: PORTABLE CHEST 1 VIEW COMPARISON:  07/02/2018 FINDINGS:  Endotracheal tube tip is about 15 mm superior to the carina. Right-sided central venous port tip overlies the right atrium. Small left pleural effusion or thickening, no change. Subsegmental atelectasis at the bases. Stable cardiomediastinal silhouette. No pneumothorax. Lytic lesion left fifth rib as before. IMPRESSION: 1. Low lung volumes with small left pleural effusion or thickening. Bibasilar atelectasis 2. Endotracheal tube tip about 15 mm superior to the carina 3. No change in lytic lesion left fifth rib Electronically Signed   By: Donavan Foil M.D.   On: 07/19/2018 02:44   Dg Abd Portable  1 View  Result Date: 06/29/2018 CLINICAL DATA:  Orogastric tube placement. EXAM: PORTABLE ABDOMEN - 1 VIEW COMPARISON:  None. FINDINGS: Nasogastric tube and side port projecting in proximal stomach. Paucity of small bowel gas. Moderate amount of retained large bowel stool. No intra-abdominal mass effect or pathologic calcifications. Soft tissue planes and included osseous structures are non suspicious. IMPRESSION: Nasogastric tube tip projecting in proximal stomach. Nonspecific bowel gas pattern. Moderate amount of retained large bowel stool suspected. Electronically Signed   By: Elon Alas M.D.   On: 07/10/2018 04:29     Procedure(s) performed:   Procedure Name: Intubation Date/Time: 06/27/2018 5:35 AM Performed by: Gregor Hams, MD Pre-anesthesia Checklist: Patient identified, Emergency Drugs available, Suction available and Patient being monitored Preoxygenation: Pre-oxygenation with 100% oxygen Induction Type: IV induction and Rapid sequence Laryngoscope Size: Mac and 4 Tube size: 7.5 mm Number of attempts: 1 Placement Confirmation: ETT inserted through vocal cords under direct vision,  CO2 detector and Breath sounds checked- equal and bilateral Secured at: 22 cm Dental Injury: Teeth and Oropharynx as per pre-operative assessment  Difficulty Due To: Difficulty was  anticipated    .Critical Care Performed by: Gregor Hams, MD Authorized by: Gregor Hams, MD   Critical care provider statement:    Critical care time (minutes):  45   Critical care time was exclusive of:  Separately billable procedures and treating other patients   Critical care was necessary to treat or prevent imminent or life-threatening deterioration of the following conditions:  Respiratory failure and sepsis   Critical care was time spent personally by me on the following activities:  Development of treatment plan with patient or surrogate, discussions with consultants, evaluation of patient's response to treatment, examination of patient, obtaining history from patient or surrogate, ordering and performing treatments and interventions, ordering and review of laboratory studies, ordering and review of radiographic studies, pulse oximetry, re-evaluation of patient's condition and review of old charts     ____________________________________________   INITIAL IMPRESSION / ASSESSMENT AND PLAN / ED COURSE  As part of my medical decision making, I reviewed the following data within the electronic MEDICAL RECORD NUMBER   52 year old female presenting with above-stated history and physical exam concerning for possible sepsis given tachycardia hypotension,.  Patient also with evident rest tori failure with respirations only 6/min on arrival.  Patient with no gag reflex.  As such patient underwent RSI.  Sepsis protocol was initiated patient received appropriate antibiotic therapy.  Patient also received 30 mL's per kilogram of normal saline.  Following normal saline bolus patient remained hypotensive and as such Levophed was initiated.  I spoke with the patient's family regarding placing a central line however they requested that a central line not be placed and the patient's Mediport be used instead and as such it was indeed used.  Patient's laboratory data notable for lactic acid of 5.1,  elevated liver enzymes and market reduction in the patient's hemoglobin from 8.5 on 07/05/2019 19-4.8 today.  Also marketed reduction in the patient's platelet count from 161 on 07/05/2019 19-53 today.  Spoke with the patient's husband and daughter on their arrival to the emergency department who informed me that the patient admitted to difficulty breathing tonight allowed by confusion.  Patient discussed with Dr. Aliene Altes hospitalist for hospital admission further evaluation and management. ____________________________________________  FINAL CLINICAL IMPRESSION(S) / ED DIAGNOSES  Final diagnoses:  Acute respiratory failure with hypoxia (HCC)  Sepsis, due to unspecified organism ([place])  Anemia, unspecified type  Thrombocytopenia ([place])     MEDICATIONS GIVEN DURING THIS VISIT:  Medications  metroNIDAZOLE (FLAGYL) IVPB 500 mg (0 mg Intravenous Stopped 07/21/2018 0435)  enoxaparin (LOVENOX) injection 40 mg (has no administration in time range)  hydrocortisone sodium succinate (SOLU-CORTEF) 100 MG injection 100 mg (has no administration in time range)  aztreonam (AZACTAM) 2 g in sodium chloride 0.9 % 100 mL IVPB (has no administration in time range)  vancomycin (VANCOCIN) IVPB 750 mg/150 ml premix (has no administration in time range)  fentaNYL 2510mg in NS 2536m(1017mml) infusion-PREMIX (has no administration in time range)  fentaNYL (SUBLIMAZE) injection 25 mcg (has no administration in time range)  propofol (DIPRIVAN) 1000 MG/100ML infusion (15 mcg/kg/min  58 kg Intravenous New Bag/Given 06/27/2018 0521)  0.9 %  sodium chloride infusion (has no administration in time range)  ondansetron (ZOFRAN) injection 4 mg (has no administration in time range)  senna-docusate (Senokot-S) tablet 1 tablet (has no administration in time range)  bisacodyl (DULCOLAX) EC tablet 5 mg (has no administration in time range)  insulin aspart (novoLOG) injection 0-9 Units (has no administration in time range)  insulin  aspart (novoLOG) injection 0-5 Units (has no administration in time range)  sodium chloride 0.9 % bolus 1,000 mL (0 mLs Intravenous Stopped 06/28/2018 0224)    And  sodium chloride 0.9 % bolus 1,000 mL (0 mLs Intravenous Stopped 07/11/2018 0258)  aztreonam (AZACTAM) 2 g in sodium chloride 0.9 % 100 mL IVPB (0 g Intravenous Stopped 07/24/2018 0258)  vancomycin (VANCOCIN) IVPB 1000 mg/200 mL premix (0 mg Intravenous Stopped 07/09/2018 0401)  succinylcholine (ANECTINE) injection 120 mg (120 mg Intravenous Given 07/14/2018 0120)  naloxone (NARCAN) injection 1 mg (1 mg Intravenous Given 07/11/2018 0116)  norepinephrine (LEVOPHED) 4mg66m D5W 250mL20mmix infusion (10 mcg/min Intravenous Rate/Dose Verify 07/22/2018 0322)  sodium bicarbonate injection 100 mEq (100 mEq Intravenous Given 07/07/2018 0343)  fentaNYL (SUBLIMAZE) injection 25 mcg (25 mcg Intravenous Given 07/03/2018 0359)  fentaNYL (SUBLIMAZE) injection 25 mcg (25 mcg Intravenous Given 06/28/2018 0415)  fentaNYL (SUBLIMAZE) injection 25 mcg (25 mcg Intravenous Given 07/05/2018 0438)     ED Discharge Orders    None       Note:  This document was prepared using Dragon voice recognition software and may include unintentional dictation errors.]    BrownGregor [phone]

## 2018-07-16 NOTE — Progress Notes (Signed)
Pharmacy Antibiotic Note   Alice Clark is a 52 y.o. female admitted on 06/26/2018 with unspecified source of infection.  Pharmacy has been consulted for vancomycin and aztreonam dosing.  Plan: DW 58kg  Vd 41L kei 0.058 hr-1  T1/2 12 hours Vancomycin 750 mg q 12 hours ordered with stacked dosing. Level before 5th dose. Goal trough 15-20.  Aztreonam 2 grams q 8 hours ordered.  Height: 5\' 4"  (162.6 cm) Weight: 127 lb 13.9 oz (58 kg) IBW/kg (Calculated) : 54.7  Temp (24hrs), Avg:97.8 F (36.6 C), Min:96.9 F (36.1 C), Max:98.6 F (37 C)  Recent Labs  Lab 06/28/2018 0238  CREATININE 0.88  LATICACIDVEN 5.1*    Estimated Creatinine Clearance: 65.3 mL/min (by C-G formula based on SCr of 0.88 mg/dL).    Allergies  Allergen Reactions  . Cefepime Other (See Comments)    Antimicrobials this admission: Metronidazole x1; aztreonam, vancomycin 8/21  >>    >>   Dose adjustments this admission:   Microbiology results: 8/21 BCx: pending      8/21 CXR: atelectasis 8/21 UA: LE(+) NO2(-) Thank you for allowing pharmacy to be a part of this patient's care.  Mirian Casco S 06/27/2018 3:31 AM

## 2018-07-16 NOTE — Consult Note (Signed)
[facility] for Electrolyte Monitoring and Replacement   Patient Measurements: Height: 5\' 4"  (162.6 cm) Weight: 140 lb 10.5 oz (63.8 kg) IBW/kg (Calculated) : 54.7  Vital Signs: Temp: 97.2 F (36.2 C) (08/21 0600) BP: 86/65 (08/21 0600) Pulse Rate: 130 (08/21 0600) Intake/Output from previous day: 08/20 0701 - 08/21 0700 In: 385.8 [I.V.:147.2; IV Piggyback:238.6] Out: -  Intake/Output from this shift: No intake/output data recorded.  Labs: Recent Labs    07/01/2018 0238  WBC 7.1  HGB 4.8*  HCT 15.1*  PLT 53*  CREATININE 0.88  MG 1.2*  PHOS 2.9  ALBUMIN <1.0*  PROT 4.0*  AST 389*  ALT 64*  ALKPHOS 315*  BILITOT 2.0*   Potassium (mmol/L)  Date Value  07/13/2018 3.2 (L)   Magnesium (mg/dL)  Date Value  07/23/2018 1.2 (L)   Calcium (mg/dL)  Date Value  07/24/2018 4.8 (LL)   Albumin (g/dL)  Date Value  07/26/2018 <1.0 (L)   Phosphorus (mg/dL)  Date Value  06/28/2018 2.9  ]  Corrected Calcium: approx 8  Estimated Creatinine Clearance: 65.3 mL/min (by C-G formula based on SCr of 0.88 mg/dL).  Assessment: Pharmacy consulted for electrolyte monitoring and replacement in 52 yo female admitted with respiratory failure/shock. Patient intubated on 8/21. Patient has PMH of end-stage metastatic ovarian cancer.   Goal of Therapy:  Electrolytes WNL K:~ 4 mmol/L Mg: ~ 2 mg/dL  Plan:  8/21 Will order Magnesium 4g IV x 1 dose. KCL 53mEq IV x 4 runs already ordered. No additional replacement needed at this time.   Will recheck electrolytes with AM labs and continue to replace as needed.   Pernell Dupre, PharmD, BCPS Clinical Pharmacist 07/15/2018 8:36 AM

## 2018-07-16 NOTE — Progress Notes (Signed)
Family Meeting Note  Advance Directive: No.  Today a meeting took place with: Patient and family (daughter, husband/DPOA).  Patientis not able to participate (intubated).  The following clinical team members were present during this meeting: MD, ED RN x2, Chaplain.  The following were discussed: Patient's diagnosis: Metastatic ovarian Ca, severe sepsis/shock/MODS, acute/subacute CVA. Patient's progosis: Poor. Goals for treatment: Full Code (for now).  Additional follow-up to be provided: PRN.  Time spent during discussion: > 25 minutes.  Arta Silence, MD

## 2018-07-16 NOTE — Progress Notes (Signed)
Patient ID: Alice Clark, female   DOB: 10/31/1966, 52 y.o.   MRN: [id number] Pulmonary/critical care  Discussed with patient's family and we wish to proceed with palliative extubation. As needed morphine and Ativan. If patient's status were to deteriorate to transition to full comfort care with titrated morphine. Patient will be changed to a DO NOT RESUSCITATE  Hermelinda Dellen, D.O.

## 2018-07-16 NOTE — Progress Notes (Signed)
eLink Physician-Brief Progress Note Patient Name: Alice Clark DOB: 12/21/1965 MRN: [id number]   Date of Service  07/15/2018  HPI/Events of Note  Metastatic Ovarian +resp failure, acidosis With acute encephalopathy  eICU Interventions  Vent support/ICU admission IVF's       Intervention Category Evaluation Type: New Patient Evaluation  Jeremiah Tarpley 07/20/2018, 5:03 AM

## 2018-07-16 NOTE — Progress Notes (Signed)
Pt transported to CT then to ICU 5 on the vent without incident. Pt remains on the vent and is tol well at this time. Report given to ICU RT.

## 2018-07-16 NOTE — Progress Notes (Signed)
eLink Physician-Brief Progress Note Patient Name: Alice Clark DOB: [birth year] MRN: [id number]   Date of Service  07/12/2018  HPI/Events of Note  CT head shows acute CVA  eICU Interventions  Prognosis is poor Recommend comfort care measures      Intervention Category Evaluation Type: Other  Flora Lipps 07/23/2018, 6:19 AM

## 2018-07-16 NOTE — H&P (Addendum)
[facility] NAME: Alice Clark    MR#:  [id number]  DATE OF BIRTH:  07-Mar-1966  DATE OF ADMISSION:  07/25/2018  PRIMARY CARE PHYSICIAN: Donnie Coffin, MD   REQUESTING/REFERRING PHYSICIAN: Gregor Hams, MD  CHIEF COMPLAINT:   Chief Complaint  Patient presents with  . Altered Mental Status    HISTORY OF PRESENT ILLNESS:  Alice Clark  is a 52 y.o. female with a known history of metastatic ovarian Ca, T2NIDDM p/w 1d Hx AMS/obtundation, SOB, acute hypoxemic respiratory failure. Pt intubated, and cannot provide Hx/ROS. Hx obtained from pt's daughter at bedside, as well as from review of prior records and discussion w/ ED provider/staff. Per my understanding, pt w/ end-stage metastatic ovarian Ca that is no longer amenable to therapy, and was up until recently DNR/DNI w/ palliative care/home hospice. I am told that her code status was recently reversed. I am also told she has a Hx of dysphagia, aspiration and esophageal candidiasis. Pt c/o pain on Monday, though her daughter is unable to provide information regarding timing, character, localization or duration of pain. A visiting home nurse administered Ativan and Morphine. She apparently felt better, but was sleepy throughout Monday and Tuesday (but was still coherent and responding properly/appropriately). @~0130AM on 06/28/2018, pt developed sudden-onset acute SOB. Pt has O2 PRN @ home, tried using, no improvement. Pt then started to yell, "Help me!" She started gasping for air and became poorly responsive. EMS was called. On ED arrival, pt obtunded, tachycardic, hypotensive, hypoxemic. Intubated. Coarse rhonchi on lung exam, gastric contents suctioned via ET tube, Lactate 5.1, PCT 19.34, suspect aspiration pneumonia. Hgb 4.8, Hx GIB. Pt aroused easily prior to receiving sedation, opened eyes and was able to nod head in response to questions.  PAST MEDICAL HISTORY:   Past Medical  History:  Diagnosis Date  . Diabetes mellitus without complication ([place])   . Diverticulitis     PAST SURGICAL HISTORY:   Past Surgical History:  Procedure Laterality Date  . ABDOMINAL HYSTERECTOMY    . APPENDECTOMY    . CESAREAN SECTION    . TOTAL ABDOMINAL HYSTERECTOMY      SOCIAL HISTORY:   Social History   Tobacco Use  . Smoking status: Never Smoker  . Smokeless tobacco: Never Used  Substance Use Topics  . Alcohol use: No    Frequency: Never    FAMILY HISTORY:   Family History  Problem Relation Age of Onset  . Breast cancer Neg Hx     DRUG ALLERGIES:   Allergies  Allergen Reactions  . Cefepime Other (See Comments)    REVIEW OF SYSTEMS:   Review of Systems  Unable to perform ROS: Intubated  Respiratory: Positive for shortness of breath.    MEDICATIONS AT HOME:   Prior to Admission medications   Medication Sig Start Date End Date Taking? Authorizing Provider  dexamethasone (DECADRON) 2 MG tablet Take 2 mg by mouth 2 (two) times daily with a meal. 05/16/18   [provider]  docusate sodium (COLACE) 100 MG capsule Take 1 capsule (100 mg total) by mouth 2 (two) times daily. 07/04/18   Vaughan Basta, MD  feeding supplement, ENSURE ENLIVE, (ENSURE ENLIVE) LIQD Take 237 mLs by mouth 3 (three) times daily between meals. 07/04/18   Vaughan Basta, MD  HYDROcodone-acetaminophen (NORCO/VICODIN) 5-325 MG tablet Take 1-2 tablets by mouth every 6 (six) hours as needed for moderate pain or severe pain. 07/04/18   Vaughan Basta,  MD  lactobacillus (FLORANEX/LACTINEX) PACK Take 1 packet (1 g total) by mouth 3 (three) times daily with meals. 07/04/18   Vaughan Basta, MD  metoprolol tartrate (LOPRESSOR) 25 MG tablet Take 0.5 tablets (12.5 mg total) by mouth 2 (two) times daily. 07/04/18   Vaughan Basta, MD  multivitamin-lutein [facility]) CAPS capsule Take 1 capsule by mouth daily. 07/05/18   Vaughan Basta, MD  nystatin  (MYCOSTATIN) 100000 UNIT/ML suspension Take 5 mLs by mouth 2 (two) times daily.    [provider]  pantoprazole (PROTONIX) 40 MG tablet Take 1 tablet (40 mg total) by mouth 2 (two) times daily before a meal. 07/04/18   Vaughan Basta, MD  polycarbophil (FIBERCON) 625 MG tablet Take 1 tablet (625 mg total) by mouth daily. 07/05/18   Vaughan Basta, MD  polyethylene glycol (MIRALAX / GLYCOLAX) packet Take 17 g by mouth daily. 07/05/18   Vaughan Basta, MD  potassium chloride (KCL) 2 mEq/mL SOLN oral liquid Take 5 mLs by mouth daily. 06/12/18   [provider]  sucralfate (CARAFATE) 1 GM/10ML suspension Take 10 mLs by mouth every 6 (six) hours. 05/31/18 06/30/18  [provider]      VITAL SIGNS:  Blood pressure (P) 109/89, pulse (!) 135, temperature (!) 97.1 F (36.2 C), resp. rate (!) 26, height 5\' 4"  (1.626 m), weight 63.8 kg, SpO2 100 %.  PHYSICAL EXAMINATION:  Physical Exam  Constitutional: She appears well-developed. She appears cachectic. She is easily aroused. She appears toxic. She has a sickly appearance. She appears ill. She is intubated.  HENT:  Head: Atraumatic.  Eyes: Conjunctivae, EOM and lids are normal. No scleral icterus.  Neck: Neck supple. No JVD present. No thyromegaly present.  Cardiovascular: Regular rhythm, S1 normal and S2 normal.  No extrasystoles are present. Tachycardia present. Exam reveals no gallop, no S3, no S4, no distant heart sounds and no friction rub.  No murmur heard. Pulmonary/Chest: No accessory muscle usage or stridor. Tachypnea noted. No apnea and no bradypnea. She is intubated. She has decreased breath sounds in the right lower field and the left lower field. She has no wheezes. She has rhonchi in the right upper field, the right middle field, the right lower field, the left upper field, the left middle field and the left lower field. She has rales in the right lower field and the left lower field.  Abdominal:  Soft. She exhibits distension. Bowel sounds are absent. There is no tenderness. There is no rigidity, no rebound and no guarding.  Musculoskeletal: Normal range of motion. She exhibits no edema or tenderness.  Lymphadenopathy:    She has no cervical adenopathy.  Neurological: She is alert and easily aroused.  Pt intubated. Pt aroused easily prior to receiving sedation, opened eyes and was able to nod head in response to questions.  Skin: Skin is warm and dry. No rash noted. She is not diaphoretic. No erythema.  Psychiatric:  Pt intubated. Pt aroused easily prior to receiving sedation, opened eyes and was able to nod head in response to questions.    LABORATORY PANEL:   CBC Recent Labs  Lab 07/20/2018 0238  WBC 7.1  HGB 4.8*  HCT 15.1*  PLT 53*   ------------------------------------------------------------------------------------------------------------------  Chemistries  Recent Labs  Lab 07/12/2018 0238  NA 138  K 3.2*  CL 113*  CO2 17*  GLUCOSE 70  BUN 23*  CREATININE 0.88  CALCIUM 4.8*  MG 1.2*  AST 389*  ALT 64*  ALKPHOS 315*  BILITOT 2.0*   ------------------------------------------------------------------------------------------------------------------  Cardiac Enzymes No results for input(s): TROPONINI in the last 168 hours. ------------------------------------------------------------------------------------------------------------------  RADIOLOGY:  Ct Head Wo Contrast  Result Date: 06/30/2018 CLINICAL DATA:  Altered level of consciousness. Code sepsis. History of metastatic ovarian cancer. EXAM: CT HEAD WITHOUT CONTRAST TECHNIQUE: Contiguous axial images were obtained from the base of the skull through the vertex without intravenous contrast. COMPARISON:  None. FINDINGS: BRAIN: A wedge-like hypodensity LEFT parietal lobe (sagittal image 37). Patchy supratentorial white matter hypodensities. Moderate parenchymal brain volume loss. No intraparenchymal hemorrhage,  mass effect or midline shift. No abnormal extra-axial fluid collections. VASCULAR: Unremarkable. SKULL/SOFT TISSUES: No skull fracture. No significant soft tissue swelling. ORBITS/SINUSES: Mild paranasal sinus mucosal thickening without air-fluid levels. Mastoid air cells are well aerated.Trace paranasal sinus mucosal thickening. Mastoid air cells are well aerated. OTHER: None. IMPRESSION: 1. 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 2. Mild chronic small vessel ischemic changes. 3. Moderate parenchymal brain volume loss, advanced for age. 4. These results will be called to the ordering clinician or representative by the Radiologist Assistant, and communication documented in the PACS or zVision Dashboard. Electronically Signed   By: Elon Alas M.D.   On: 06/29/2018 05:09   Dg Chest Port 1 View  Result Date: 07/13/2018 CLINICAL DATA:  Respiratory failure, post intubation EXAM: PORTABLE CHEST 1 VIEW COMPARISON:  07/02/2018 FINDINGS: Endotracheal tube tip is about 15 mm superior to the carina. Right-sided central venous port tip overlies the right atrium. Small left pleural effusion or thickening, no change. Subsegmental atelectasis at the bases. Stable cardiomediastinal silhouette. No pneumothorax. Lytic lesion left fifth rib as before. IMPRESSION: 1. Low lung volumes with small left pleural effusion or thickening. Bibasilar atelectasis 2. Endotracheal tube tip about 15 mm superior to the carina 3. No change in lytic lesion left fifth rib Electronically Signed   By: Donavan Foil M.D.   On: 07/14/2018 02:44   Dg Abd Portable 1 View  Result Date: 07/15/2018 CLINICAL DATA:  Orogastric tube placement. EXAM: PORTABLE ABDOMEN - 1 VIEW COMPARISON:  None. FINDINGS: Nasogastric tube and side port projecting in proximal stomach. Paucity of small bowel gas. Moderate amount of retained large bowel stool. No intra-abdominal mass effect or pathologic  calcifications. Soft tissue planes and included osseous structures are non suspicious. IMPRESSION: Nasogastric tube tip projecting in proximal stomach. Nonspecific bowel gas pattern. Moderate amount of retained large bowel stool suspected. Electronically Signed   By: Elon Alas M.D.   On: 07/16/2018 04:29   IMPRESSION AND PLAN:   A/P: 65F w/ metastatic ovarian Ca, T2NIDDM p/w 1d Hx AMS/obtundation, SOB, acute hypoxemic respiratory failure. Hypotensive. Suspected sepsis 2/2 aspiration pneumonia, Lactate 5.1, PCT 19.34, severe sepsis + septic shock. Severe normocytic anemia, suspected GI blood loss. CT head (+) acute vs. subacute L parietal MCA/posterior watershed territory nonhemorrhagic infarct. Non anion gap metabolic acidosis, hypokalemia, hypocalcemia, hypomagnesemia, hypoalbuminemia, transaminasemia, hyperbilirubinemia, thrombocytopenia, coagulopathy (elevated INR). -SOB, acute hypoxemic respiratory failure, hypotension, suspected sepsis 2/2 suspected aspiration pneumonia (w/ Lactate 5.1, PCT 19.34, severe sepsis + septic shock): Hypoxemic. Tachycardic, tachypneic, hypotensive. Levophed. Gastric contents suctioned from ET tube. CXR (+) low lung volumes + small L pleural effusion/thickening + bibasilar atelectasis. Suspect aspiration pneumonia, however pt recently hospitalized and has active Ca w/ severe malnutrition and immunosuppression, covered broadly in ED, cont'd. Latate + PCT high. BCx. IVF. EF 50% as of 07/03/2018 Echo. -Severe normocytic anemia, suspected GI blood loss: Per prior documentation and last admission, Hx GI blood loss. Being transfused. Not on anticoagulation/antiplatelet agents.  Coags pending. Suspect GI blood loss. Protonix 40mg  IV BID. Carafate 1g q6h. Monitor CBC. -Stroke: CT head (+) acute vs. subacute L parietal MCA/posterior watershed territory nonhemorrhagic infarct. Concomitant GI bleed and transaminasemia. Not starting ASA, statin or anticoagulation. Had recent Echo.  NIHSS, neuro checks. Not ordered for carotid U/S or MRI brain, as these test are not likely to change mgmt in the acute setting. Not on CVA pathway, not ordered for P/T, O/T, SLP. -Metabolic acidosis: Na+ WNL, hypochloremic, hypokalemic. Mild azotemia, lactic acidosis. Profoundly low albumin. Received bicarbonate pushes in ED. Monitor BMP. -Hypokalemia, hypomagnesemia, hypocalcemia: K+ 3.2, Mag 1.2, Ca 4.8. Replete K+ and Mag. Ionized calcium level. -Hypoalbuminemia, transaminasemia, hyperbilirubinemia, thrombocytopenia, coagulopathy (elevated INR): Albumin 1. Cachexia. Likely severe malnourishment. Prealbumin pending. Suspect severely impaired hepatic synthetic function, w/ possible concomitant (early) shock liver, cholestasis 2/2 sepsis. AST 389, ALT 64. TBili 2.0. Plt 53. INR 3.20. Thrombocytopenia may also be due to blood loss/anemia. INR elevation may also be due to nutritional deficiency w/ low vitamin K, eradication of gut flora by ABx, impaired factor synthesis. No antiplatelet agents or anticoagulants ordered. -T2NIDDM: SSI. Hold PO antihyperglycemics. -Hold PO home meds. -FEN/GI: NPO, intubated. -DVT PPx: SCDs, no pharmacological DVT PPx (Hgb 4.3, Plt 53). -Code status: Full code. Had two rather long conversations, each lasting ~86min, one with pt's daughter and one w/ pt's husband/DPOA. I have attempted to impress upon both individuals the acuity/severity of pt's presentation. Per my professional opinion, the pt's present clinical condition portends an extremely high risk of mortality in the acute setting. I believe (in a professional capacity) that the pt is in septic shock with multiple organ dysfunction syndrome. I believe (in a professional capacity) that the pt's labwork represents early global hypoxia/ischemia. I believe (in a professional capacity) that the pt lacks the physiological reserve and immunological capacity to overcome her present insult. Even if she defeats probability and  survives, given her poor nutritional status and immunosuppression, I do not expect (in a professional capacity) any semblance of meaningful recovery/healing. I do not believe (in a professional capacity) that CPR will be of benefit to the pt. Overall, the pt is in a globally poor state. Unfortunately, despite my best efforts, I am unsure as to whether I was successful in helping the pt's family to adequately understand the pt's current condition and prognosis. She will be designated as full code for now, subject to change pending further discussion. -Disposition: Admission, > 2 midnights.   All the records are reviewed and case discussed with ED provider. Management plans discussed with the patient, family and they are in agreement.  CODE STATUS: Full code  TOTAL TIME TAKING CARE OF THIS PATIENT: 110 minutes.    Arta Silence M.D on 07/25/2018 at 5:30 AM  Between 7am to 6pm - Pager - [phone]  After 6pm go to [email or web address] - [employer] [facility]  Office  [phone]  CC: Primary care physician; Donnie Coffin, MD   Note: This dictation was prepared with Dragon dictation along with smaller phrase technology. Any transcriptional errors that result from this process are unintentional.

## 2018-07-16 NOTE — Progress Notes (Signed)
eLink Physician-Brief Progress Note Patient Name: Alice Clark DOB: 06-07-1966 MRN: [id number]   Date of Service  06/30/2018  HPI/Events of Note  Patient terminally extubated earlier today. Request for order for comfort care and transfer to palliative care bed. Spoke with patient's husband, Alice Clark, who confirms that given patient underlying metastatic cancer that he is in agreement with this course of action. He and the family desire to allow the patient to pass with comfort and dignity.  Patient is already terminally extubated and is comfortable with PRN morphine and versed orders.   eICU Interventions  Will order: 1. Comfort measures. 2. Transfer to palliative care bed.      Intervention Category Major Interventions: Other:  Lysle Dingwall 07/04/2018, 9:48 PM

## 2018-07-16 NOTE — Progress Notes (Signed)
Went in and observed patient and family present at bedside. I informed the patient's family that myself and Renee, RN would be the nurses caring for the patient this shift.  I told them that we would continue to watch the patient from the monitor, and that if they believe that the patient is in pain or discomfort, to call us. Bereavement cart ordered and brought up by dietary.  Patient appears comfortable at this time.  Will continue to monitor.  Cameron Ali, RN

## 2018-07-16 NOTE — Progress Notes (Signed)
Chaplain was referred while rounding. Chaplain introduced himself and offered prayer and prayed. Chaplain will monitor throughout the night.    06/26/2018 1700  Clinical Encounter Type  Visited With Patient and family together  Visit Type Spiritual support  Referral From Nurse  Spiritual Encounters  Spiritual Needs Prayer

## 2018-07-16 NOTE — Progress Notes (Signed)
HOB elevated to high fowlers position, cuff deflated, suctioned orally and endotracheally and extubated to 2 lpm O2 [place]

## 2018-07-16 NOTE — ED Triage Notes (Signed)
Pt presents to ED via EMS from home after she was said to become unresponsive suddenly by her family just prior to arrival. Pt dx with ovarian CA in January and was told last month she is no longer a candidate for chemotherapy. Followed by hospice. Pt not responsive to verbal or painful stimuli at this time. Unlabored respirations.Although pt had discussed becoming a DNR at her last hospital stay family states they would like everything possible done for pt and report pt is to be treated as a full code.

## 2018-07-16 NOTE — Progress Notes (Signed)
Contacted [place] [facility] and spoke to [employer]. Patient would only be eligible for eye donation.  The reference number is: [id number].   Cameron Ali, RN

## 2018-07-16 NOTE — Progress Notes (Signed)
Admitted morning for altered mental status with acute hypoxic respiratory failure.  Patient has history of widely spread metastatic ovarian cancer followed by home hospice for the DNR/DNI status admitted for respiratory failure, intubation. #1 acute respiratory failure with hypoxia secondary to pneumonia, septic shock, patient is on Levophed, Azactam, Solu-Cortef, vancomycin, Flagyl.  Patient has widely spread of ovarian cancer with end-stage cancer followed by hospice and palliative care now intubated.  ICU team spoke with family to see if she can be made comfort care because of her advanced cancer. 2.  Hypokalemia, hypomagnesemia [place] severe anemia hemoglobin 4.8, receiving transfusion.  INR is 3.2. 3.  Acute left parietal infarct on CT head that is done  CODE STATUS DNR.  Time spent 20 minutes reviewing the chart.

## 2018-07-16 NOTE — Progress Notes (Signed)
Chaplain received page from ED to come to family room for family of patient. Chaplain arrived at family room and introduce herself as on-call Chaplain and she would be waiting with them until they were able to visit with patient. Doctor came and got Chaplain and family to visit patient. Doctor informed family of patient status. Family visit with patient and then was ask to go to family room again. Chaplain escorted family back. The nurse came out to get family again and Admitting doctor spoke to family and told the family to prepare themselves for patient was not doing well. Chaplain escorted family to ICU and gave them privacy and informed nurses that they were there waiting and to page Chaplain when needed.

## 2018-07-16 NOTE — Progress Notes (Signed)
   07/01/2018 1040  Clinical Encounter Type  Visited With Patient and family together  Visit Type Follow-up   Based on previous on-call chaplain morning report, unit chaplain followed up with patient and family regarding ongoing support.  Family declined visit at this time.

## 2018-07-16 NOTE — Progress Notes (Signed)
Visit made. Patient is currently followed by [facility] at home with a hospice diagnosis of metastatic ovarian cancer. She is a FULL code. Patient was sent to the [facility] ED overnight at request of her family when she was found to be unresponsive with agonal breathing. She required intubation. Patient seen lying in bed, intubated on a propofol drip,  several family members present in the room, son Linward Foster in the bed with his mother. Writer spoke with patient's sister Darryll Capers, she relayed the plan for extubation "later this afternoon", confirmed by NP Darlyn Chamber and chart note review. Emotional support given. Will continue to follow and update hospice team and provide support.  Flo Shanks RN, BSN, [place] [facility], hospital Liaison [phone]

## 2018-07-16 NOTE — Progress Notes (Signed)
Chaplain received page that doctor needed me as she talk to family. Chaplain arrived and the doctor was explain that family needed to make the best decision for patient. Husband was waiting on sons arrival to decide. Son arrived and doctor began to explain and son ask everyone to give him sometime alone with his mother. Chaplain left and ask nurse to page if needed.

## 2018-07-16 NOTE — Consult Note (Signed)
Reason for Consult Respiratory failure / Septic Shock Referring Physician:   KENLY HENCKEL is an 52 y.o. female.  HPI: Alice Clark is a 52 year old African-American female with a past medical history remarkable for widely metastatic ovarian cancer, type 2 diabetes, diverticulitis, presented with one-day history of altered mental status, shortness of breath and found to be in hypoxemic respiratory failure patient was intubated and unable to provide history or review of systems. Patient has a history of end-stage metastatic ovarian cancer, not amenable to any additional therapy, was a DO NOT RESUSCITATE/DO NOT INTUBATE with palliative care/home hospice. Apparently she had changed her CODE STATUS. She also has a past medical history of dysphagia, aspiration and esophageal candidiasis. She has home nursing to assist in her care. Does get as needed Ativan and morphine. Upon arrival to the emergency department she was intubated for respiratory failure, was found to be hypotensive, started on fluid resuscitation along with pressors, also was noted to have a hemoglobin of 4.8, lactic acid of 5.1 and an elevated pro-calcitonin at 19.34. This morning her family is all in her bedside having ongoing discussions about future goals of care for her with discussions of comfort care and palliative extubation  Past Medical History:  Diagnosis Date  . Diabetes mellitus without complication ([place])   . Diverticulitis     Past Surgical History:  Procedure Laterality Date  . ABDOMINAL HYSTERECTOMY    . APPENDECTOMY    . CESAREAN SECTION    . TOTAL ABDOMINAL HYSTERECTOMY      Family History  Problem Relation Age of Onset  . Breast cancer Neg Hx     Social History:  reports that she has never smoked. She has never used smokeless tobacco. She reports that she does not drink alcohol or use drugs.  Allergies:  Allergies  Allergen Reactions  . Cefepime Other (See Comments)    Medications: I have reviewed the  patient's current medications.  Results for orders placed or performed during the hospital encounter of 07/25/2018 (from the past 48 hour(s))  Urinalysis, Complete w Microscopic     Status: Abnormal   Collection Time: 07/01/2018  1:30 AM  Result Value Ref Range   Color, Urine AMBER (A) YELLOW    Comment: BIOCHEMICALS MAY BE AFFECTED BY COLOR   APPearance CLOUDY (A) CLEAR   Specific Gravity, Urine 1.016 1.005 - 1.030   pH 5.0 5.0 - 8.0   Glucose, UA NEGATIVE NEGATIVE mg/dL   Hgb urine dipstick MODERATE (A) NEGATIVE   Bilirubin Urine SMALL (A) NEGATIVE   Ketones, ur NEGATIVE NEGATIVE mg/dL   Protein, ur 100 (A) NEGATIVE mg/dL   Nitrite NEGATIVE NEGATIVE   Leukocytes, UA MODERATE (A) NEGATIVE    Comment: Performed at [facility], [address] [place], [place] [address]  Lactic acid, plasma     Status: Abnormal   Collection Time: 07/20/2018  2:38 AM  Result Value Ref Range   Lactic Acid, Venous 5.1 (HH) 0.5 - 1.9 mmol/L    Comment: CRITICAL RESULT CALLED TO, READ BACK BY AND VERIFIED WITH ANDREA BRYANT ON 07/11/2018 AT 0314 JAG Performed at [facility], [address] [place], [place] [address]   CBC with Differential/Platelet     Status: Abnormal   Collection Time: 07/09/2018  2:38 AM  Result Value Ref Range   WBC 7.1 3.6 - 11.0 K/uL   RBC 1.60 (L) 3.80 - 5.20 MIL/uL   Hemoglobin 4.8 (LL) 12.0 - 16.0 g/dL    Comment: CRITICAL  RESULT CALLED TO, READ BACK BY AND VERIFIED WITH: C/ ANDREA BRYANT @0330  07/24/2018 [facility]    HCT 15.1 (L) 35.0 - 47.0 %   MCV 94.2 80.0 - 100.0 fL   MCH 30.1 26.0 - 34.0 pg   MCHC 32.0 32.0 - 36.0 g/dL   RDW 18.6 (H) 11.5 - 14.5 %   Platelets 53 (L) 150 - 440 K/uL    Comment: PLATELET COUNT CONFIRMED BY SMEAR   Neutrophils Relative % 86 %   Lymphocytes Relative 2 %   Monocytes Relative 0 %   Eosinophils Relative 0 %   Basophils Relative 0 %   Band Neutrophils 9 %   Metamyelocytes Relative 2 %   Myelocytes 1 %   Promyelocytes Relative 0  %   Blasts 0 %   nRBC 0 0 /100 WBC   Other 0 %   Neutro Abs 7.0 (H) 1.4 - 6.5 K/uL   Lymphs Abs 0.1 (L) 1.0 - 3.6 K/uL   Monocytes Absolute 0.0 (L) 0.2 - 0.9 K/uL   Eosinophils Absolute 0.0 0 - 0.7 K/uL   Basophils Absolute 0.0 0 - 0.1 K/uL   RBC Morphology MIXED RBC POPULATION    WBC Morphology TOXIC GRANULATION     Comment: DOHLE BODIES VACUOLATED NEUTROPHILS Performed at [facility], [address] [place], [place] [address]   Comprehensive metabolic panel     Status: Abnormal   Collection Time: 07/10/2018  2:38 AM  Result Value Ref Range   Sodium 138 135 - 145 mmol/L   Potassium 3.2 (L) 3.5 - 5.1 mmol/L   Chloride 113 (H) 98 - 111 mmol/L   CO2 17 (L) 22 - 32 mmol/L   Glucose, Bld 70 70 - 99 mg/dL   BUN 23 (H) 6 - 20 mg/dL   Creatinine, Ser 0.88 0.44 - 1.00 mg/dL   Calcium 4.8 (LL) 8.9 - 10.3 mg/dL    Comment: CRITICAL RESULT CALLED TO, READ BACK BY AND VERIFIED WITH ANDREA BRYANT ON 07/10/2018 AT 0314 JAG    Total Protein 4.0 (L) 6.5 - 8.1 g/dL   Albumin <1.0 (L) 3.5 - 5.0 g/dL   AST 389 (H) 15 - 41 U/L   ALT 64 (H) 0 - 44 U/L   Alkaline Phosphatase 315 (H) 38 - 126 U/L   Total Bilirubin 2.0 (H) 0.3 - 1.2 mg/dL   GFR calc non Af Amer >60 >60 mL/min   GFR calc Af Amer >60 >60 mL/min    Comment: (NOTE) The eGFR has been calculated using the CKD EPI equation. This calculation has not been validated in all clinical situations. eGFR's persistently <60 mL/min signify possible Chronic Kidney Disease.    Anion gap 8 5 - 15    Comment: Performed at [facility], [address] [place], [place] [address]  Brain natriuretic peptide     Status: Abnormal   Collection Time: 07/05/2018  2:38 AM  Result Value Ref Range   B Natriuretic Peptide 126.0 (H) 0.0 - 100.0 pg/mL    Comment: Performed at [facility], [address] [place], [place] [address]  Procalcitonin - Baseline     Status: None   Collection Time: 07/02/2018  2:38 AM  Result Value Ref  Range   Procalcitonin 19.34 ng/mL    Comment:        Interpretation: PCT >= 10 ng/mL: Important systemic inflammatory response, almost exclusively due to severe bacterial sepsis or septic shock. (NOTE)       Sepsis  PCT Algorithm           Lower Respiratory Tract                                      Infection PCT Algorithm    ----------------------------     ----------------------------         PCT < 0.25 ng/mL                PCT < 0.10 ng/mL         Strongly encourage             Strongly discourage   discontinuation of antibiotics    initiation of antibiotics    ----------------------------     -----------------------------       PCT 0.25 - 0.50 ng/mL            PCT 0.10 - 0.25 ng/mL               OR       >80% decrease in PCT            Discourage initiation of                                            antibiotics      Encourage discontinuation           of antibiotics    ----------------------------     -----------------------------         PCT >= 0.50 ng/mL              PCT 0.26 - 0.50 ng/mL                AND       <80% decrease in PCT             Encourage initiation of                                             antibiotics       Encourage continuation           of antibiotics    ----------------------------     -----------------------------        PCT >= 0.50 ng/mL                  PCT > 0.50 ng/mL               AND         increase in PCT                  Strongly encourage                                      initiation of antibiotics    Strongly encourage escalation           of antibiotics                                     -----------------------------  PCT <= 0.25 ng/mL                                                 OR                                        > 80% decrease in PCT                                     Discontinue / Do not initiate                                             antibiotics Performed at  [facility], [address] [place], [place] [address]   Magnesium     Status: Abnormal   Collection Time: 07/05/2018  2:38 AM  Result Value Ref Range   Magnesium 1.2 (L) 1.7 - 2.4 mg/dL    Comment: Performed at [facility], [address] [place], [place] [address]  Phosphorus     Status: None   Collection Time: 07/04/2018  2:38 AM  Result Value Ref Range   Phosphorus 2.9 2.5 - 4.6 mg/dL    Comment: Performed at [facility], [address] [place], [place] [address]  CK     Status: Abnormal   Collection Time: 07/25/2018  2:38 AM  Result Value Ref Range   Total CK 20 (L) 38 - 234 U/L    Comment: Performed at [facility], [address] [place], [place] [address]  Blood gas, venous     Status: Abnormal   Collection Time: 07/09/2018  3:42 AM  Result Value Ref Range   FIO2 0.40    Delivery systems VENTILATOR    Mode ASSIST CONTROL    VT 400 mL   Peep/cpap 5.0 cm H20   pH, Ven 7.21 (L) 7.250 - 7.430   pCO2, Ven 39 (L) 44.0 - 60.0 mmHg   pO2, Ven 58.0 (H) 32.0 - 45.0 mmHg   Bicarbonate 15.6 (L) 20.0 - 28.0 mmol/L   Acid-base deficit 11.6 (H) 0.0 - 2.0 mmol/L   O2 Saturation 82.8 %   Patient temperature 37.0    Collection site VENOUS    Sample type VENOUS    Mechanical Rate 18     Comment: Performed at [facility], [address] [place], [place] [address]  Type and screen [place]     Status: None   Collection Time: 07/22/2018  3:48 AM  Result Value Ref Range   ABO/RH(D) O POS    Antibody Screen NEG    Sample Expiration      07/19/2018 Performed at [facility], [address] [place], [place] [address]   Protime-INR     Status: Abnormal   Collection Time: 07/03/2018  3:52 AM  Result Value Ref Range   Prothrombin Time 32.5 (H) 11.4 - 15.2 seconds   INR 3.20     Comment: Performed at [facility], [address], [place], [place] [address]  MRSA PCR Screening  Status: Abnormal    Collection Time: 07/04/2018  5:17 AM  Result Value Ref Range   MRSA by PCR POSITIVE (A) NEGATIVE    Comment:        The GeneXpert MRSA Assay (FDA approved for NASAL specimens only), is one component of a comprehensive MRSA colonization surveillance program. It is not intended to diagnose MRSA infection nor to guide or monitor treatment for MRSA infections. RESULT CALLED TO, READ BACK BY AND VERIFIED WITH:  Laurin Coder AT 0034 07/15/2018 SDR Performed at [facility], [address] [place], [place] [address]   Glucose, capillary     Status: None   Collection Time: 07/08/2018  5:17 AM  Result Value Ref Range   Glucose-Capillary 84 70 - 99 mg/dL    Ct Head Wo Contrast  Result Date: 06/26/2018 CLINICAL DATA:  Altered level of consciousness. Code sepsis. History of metastatic ovarian cancer. EXAM: CT HEAD WITHOUT CONTRAST TECHNIQUE: Contiguous axial images were obtained from the base of the skull through the vertex without intravenous contrast. COMPARISON:  None. FINDINGS: BRAIN: A wedge-like hypodensity LEFT parietal lobe (sagittal image 37). Patchy supratentorial white matter hypodensities. Moderate parenchymal brain volume loss. No intraparenchymal hemorrhage, mass effect or midline shift. No abnormal extra-axial fluid collections. VASCULAR: Unremarkable. SKULL/SOFT TISSUES: No skull fracture. No significant soft tissue swelling. ORBITS/SINUSES: Mild paranasal sinus mucosal thickening without air-fluid levels. Mastoid air cells are well aerated.Trace paranasal sinus mucosal thickening. Mastoid air cells are well aerated. OTHER: None. IMPRESSION: 1. 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 2. Mild chronic small vessel ischemic changes. 3. Moderate parenchymal brain volume loss, advanced for age. 4. These results will be called to the ordering clinician or representative by the Radiologist Assistant, and communication documented in the  PACS or zVision Dashboard. Electronically Signed   By: Elon Alas M.D.   On: 06/28/2018 05:09   Dg Chest Port 1 View  Result Date: 07/21/2018 CLINICAL DATA:  Respiratory failure, post intubation EXAM: PORTABLE CHEST 1 VIEW COMPARISON:  07/02/2018 FINDINGS: Endotracheal tube tip is about 15 mm superior to the carina. Right-sided central venous port tip overlies the right atrium. Small left pleural effusion or thickening, no change. Subsegmental atelectasis at the bases. Stable cardiomediastinal silhouette. No pneumothorax. Lytic lesion left fifth rib as before. IMPRESSION: 1. Low lung volumes with small left pleural effusion or thickening. Bibasilar atelectasis 2. Endotracheal tube tip about 15 mm superior to the carina 3. No change in lytic lesion left fifth rib Electronically Signed   By: Donavan Foil M.D.   On: 07/13/2018 02:44   Dg Abd Portable 1 View  Result Date: 07/01/2018 CLINICAL DATA:  Orogastric tube placement. EXAM: PORTABLE ABDOMEN - 1 VIEW COMPARISON:  None. FINDINGS: Nasogastric tube and side port projecting in proximal stomach. Paucity of small bowel gas. Moderate amount of retained large bowel stool. No intra-abdominal mass effect or pathologic calcifications. Soft tissue planes and included osseous structures are non suspicious. IMPRESSION: Nasogastric tube tip projecting in proximal stomach. Nonspecific bowel gas pattern. Moderate amount of retained large bowel stool suspected. Electronically Signed   By: Elon Alas M.D.   On: 07/08/2018 04:29    ROS Blood pressure (!) 86/65, pulse (!) 130, temperature (!) 97.2 F (36.2 C), resp. rate (!) 24, height 5' 4"  (1.626 m), weight 63.8 kg, SpO2 100 %. Physical Exam Vital signs: Please see the above listed vital signs General: Patient is a moribund cachectic female presently intubated minimally responsive HEENT: Trachea midline, oral  endotracheal tube in place, oral thrush, no jugular venous distention is noted, severe muscle  wasting and cachexia Cardiovascular: Regular rhythm, tachycardia at 1:30 noted Pulmonary: Central secretions appreciated coarse diffuse rhonchi right greater than left Abdominal: Some distention, Active bowel sounds Extremities: 2+ pitting edema Neurologic: Severe weakness, moves upper extremities to family's questions Cutaneous: No rashes or lesions noted  Assessment/Plan:  Widely metastatic ovarian cancer, not amenable to any additional oncologic intervention. Patient is end-stage.Suffering now from respiratory failurepneumonia, requiring mechanical ventilation.   Septic shock, presently on norepinephrine, aztreonam, Solu-Cortef, vancomycin, Flagyl. Discussions with family about goals of care at this point waiting for their decision  Hypomagnesemia we'll replace  Severe anemia. Hemoglobin was 4.8, status post transfusion, pending repeat value  Hypokalemia. We'll replace  Hypocalcemia  Lactic acidosis  Prerenal azotemia  Critical  care time 40 minutes  Zyliah Schier 07/03/2018, 7:53 AM

## 2018-07-18 LAB — CULTURE, BLOOD (ROUTINE X 2)
SPECIAL REQUESTS: ADEQUATE
Special Requests: ADEQUATE

## 2018-07-27 NOTE — Progress Notes (Signed)
Visit made. Patient was pronounced at 8:39 am this morning. Family remains at bedside. Emotional support given. Family appreciative of visit. Hospice team updated.  Flo Shanks RN, BSN, [facility] [place], hospital Liaison [phone]

## 2018-07-27 NOTE — Progress Notes (Signed)
Nutrition Brief Note  Chart reviewed. Patient has transitioned to comfort care.   No nutrition interventions warranted at this time. Please consult RD as needed.   Willey Blade, MS, [place], LDN Office: [phone] Pager: [phone] After Hours/Weekend Pager: [phone]

## 2018-07-27 NOTE — Discharge Summary (Signed)
    Death Note please see Last Note for all details.   In breif -52 year old female patient with metastatic ovarian cancer brought in from home because of altered mental status.  Intubated in the emergency room for respiratory difficulty, altered mental status patient found to have hypotension, tachycardia, admitted to intensive care unit because he is she is intubated, had septic shock, she also has underlying history of diabetes mellitus type 2, diverticulitis, patient has advanced cancer not amenable to any treatment with the DO NOT RESUSCITATE status and followed by palliative care, hospice at home, patient changed her CODE STATUS so she is intubated brought to ICU, patient had elevated lactic acid 5.1 with procalcitonin 19.34.  Patient initially intubated, admitted to ICU but after discussing with family members, family chose comfort care with palliative extubation.  And pronounced dead today at 8:39 AM.    Pronounced dead by RN on08-25-19 at 80 9 AM. Cause of death  Metastatic ovarian cancer Respiratory failure Septic shock Total clinical and documentation time for today Under 30 minutes   Last Note

## 2018-07-27 NOTE — Progress Notes (Signed)
Upon entering the pt's room to perform shift assessment, pt without respirations and apical pulse; pt DNR/NMP; Rande Brunt RN 2nd RN pronounced; emotional and spiritual support given to the family at the bedside; post mortem care initiated

## 2018-07-27 NOTE — Progress Notes (Signed)
   2018/08/13 1020  Clinical Encounter Type  Visited With Family  Visit Type Follow-up;Death  Referral From Nurse  Consult/Referral To Chaplain  Spiritual Encounters  Spiritual Needs Emotional;Grief support   While rounding on 1C, CH was notified that Alice Clark had died. I reported to the patient's room and offered my assistance to the family. No assistance was needed. I offered my condolence and departed for the family to be alone.

## 2018-07-27 NOTE — Progress Notes (Signed)
Chaplain responded to OR for EOL support while in ICU. Pt moved to 122. Chaplain checked with family for support needs. The family said there were no needs at this time. Chaplain will continue to follow.    07-19-2018 0100  Clinical Encounter Type  Visited With Patient and family together  Visit Type Spiritual support  Spiritual Encounters  Spiritual Needs Grief support

## 2018-07-27 DEATH — deceased

## 2019-08-10 IMAGING — DX DG CHEST 1V PORT
1 series · 2 of 2 positions shown · non-contrast
Comparison: 08/17/2017.

CLINICAL DATA: Substernal chest pain since this morning. History of
diabetes. Reportedly undergoing chemotherapy and in hospice. History
of lytic bone lesion.

EXAM:
PORTABLE CHEST 1 VIEW

[Series 1: chest ap · 0.14mm/px · 2 of 2 slices shown]
[im 1/2]
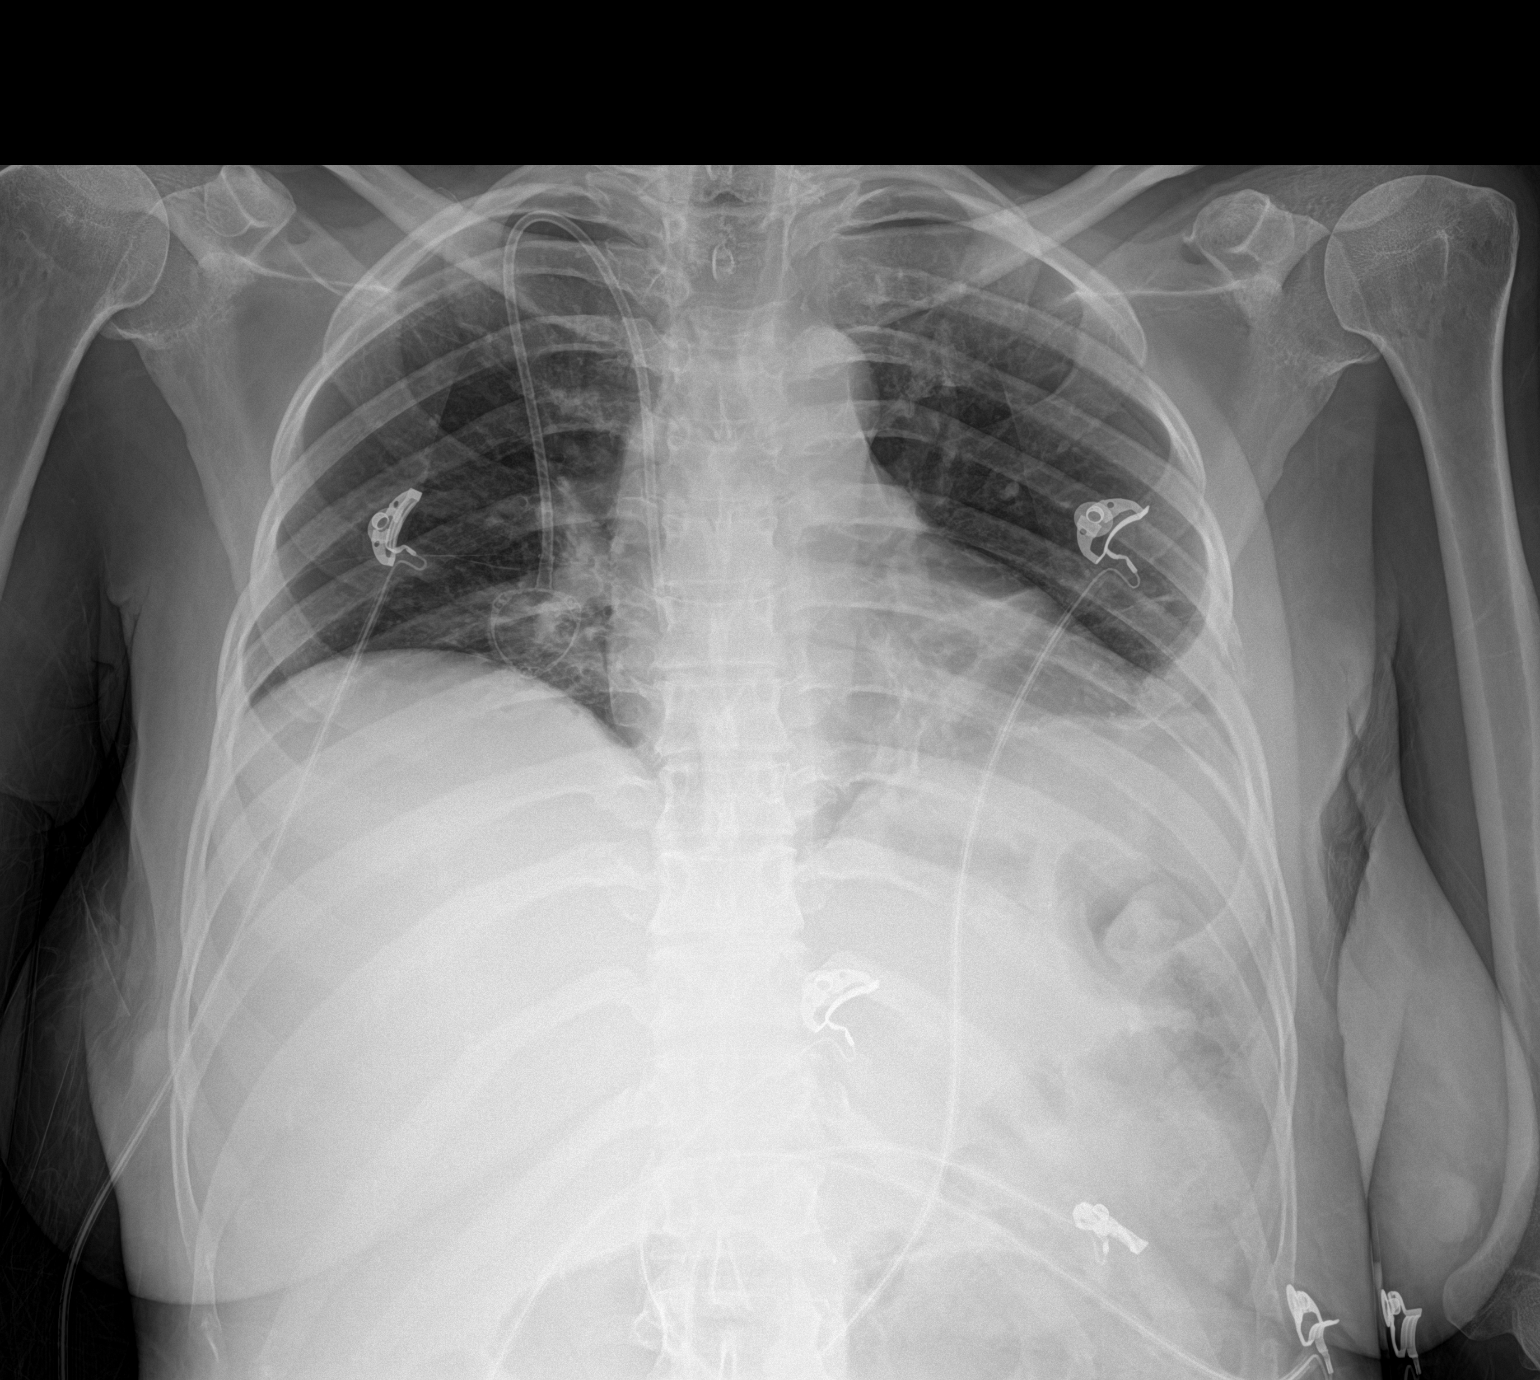
[im 2/2]
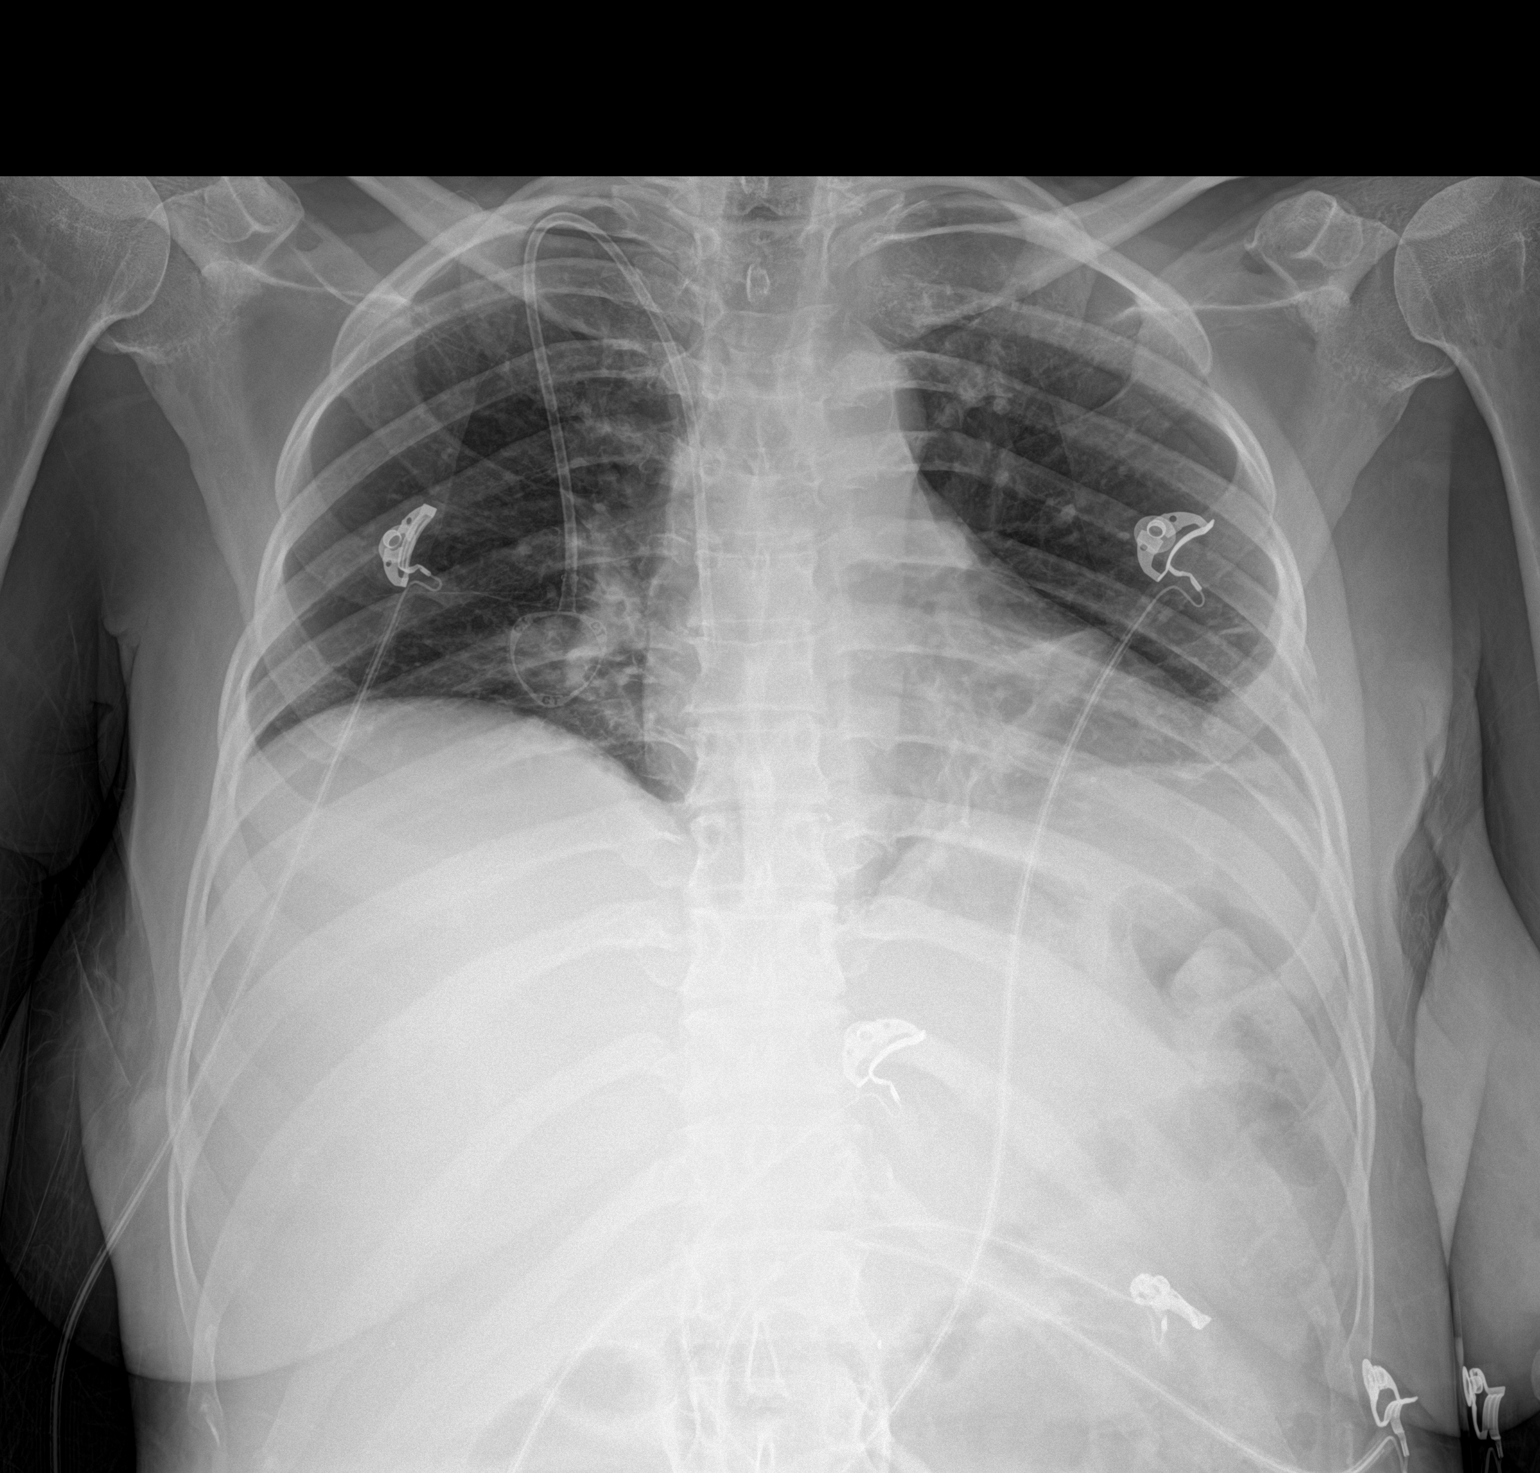

[2 of 2 positions shown; findings below may reference images not displayed]

FINDINGS: 1492 hours. There is a right IJ Port-A-Cath which extends to the
superior cavoatrial junction level. The heart size and mediastinal
contours are stable. There are lower lung volumes with increased
bibasilar pulmonary opacities, which are radiographically most
consistent with atelectasis. There is a small left pleural effusion.
There is apparent lytic destruction of the left 5th rib laterally.
No other osseous abnormalities are seen.
IMPRESSION: Low lung volumes with bibasilar pulmonary opacities radiographically
most consistent with atelectasis. Early infection less likely. Lytic
destruction of the left 5th rib laterally as reported in the
patient's progress notes from [place], although new from available
prior radiographs.

## 2019-08-27 IMAGING — CT CT HEAD W/O CM
3 series · 16 of 46 positions shown, 19 images · non-contrast
Comparison: None.

CLINICAL DATA: Altered level of consciousness. Code sepsis. History
of metastatic ovarian cancer.

EXAM:
CT HEAD WITHOUT CONTRAST
TECHNIQUE: Contiguous axial images were obtained from the base of the skull
through the vertex without intravenous contrast.

[Series 3: head wo · axial · 0.43mm/px · z∈[-66,+54]mm · 10 of 29 slices shown, 13 images]
[im 3/29  brain]
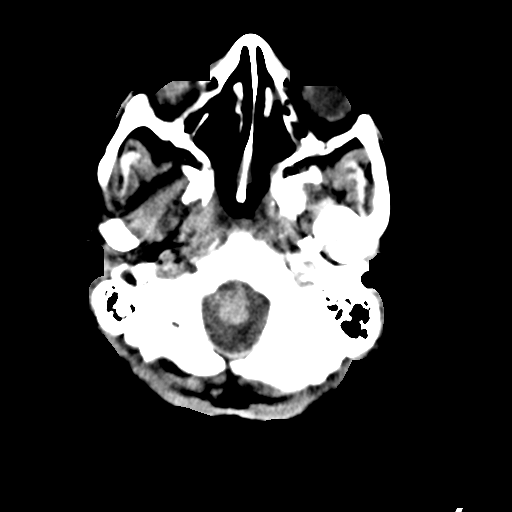
[im 3/29  bone]
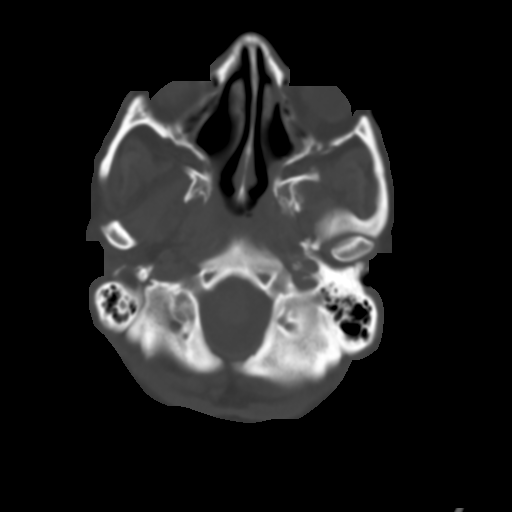
[im 6/29  brain]
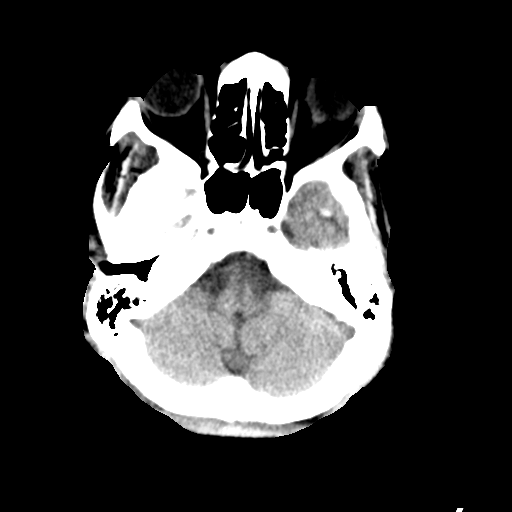
[im 8/29  brain]
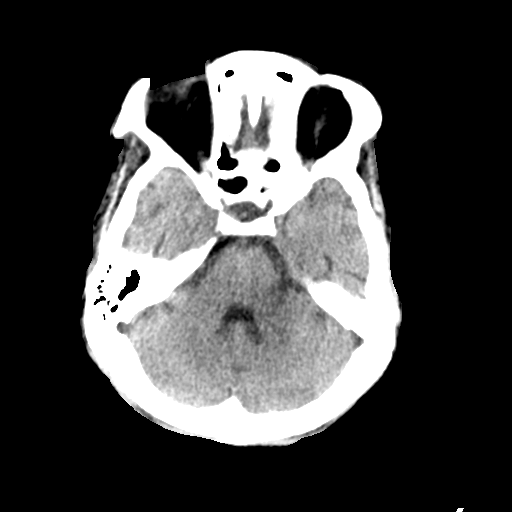
[im 11/29  brain]
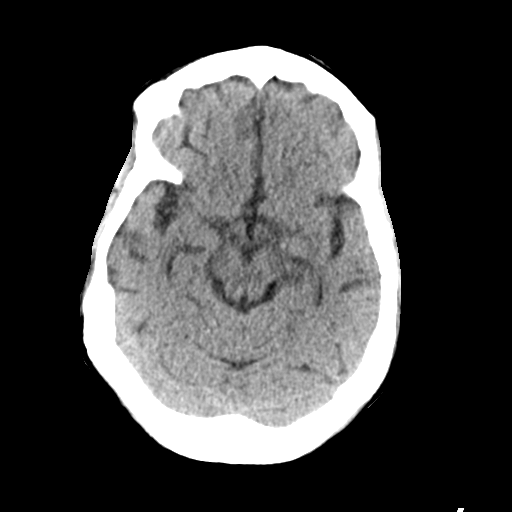
[im 14/29  brain]
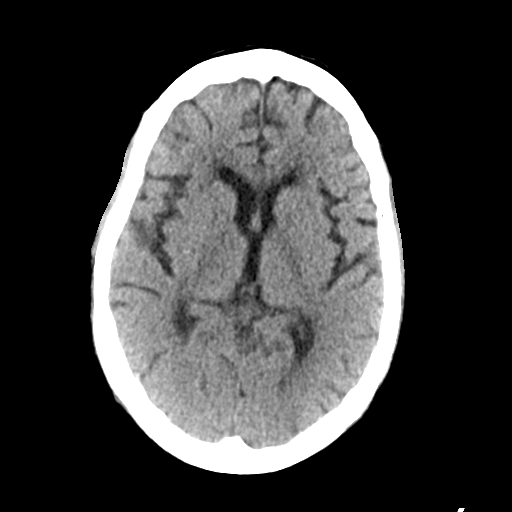
[im 14/29  bone]
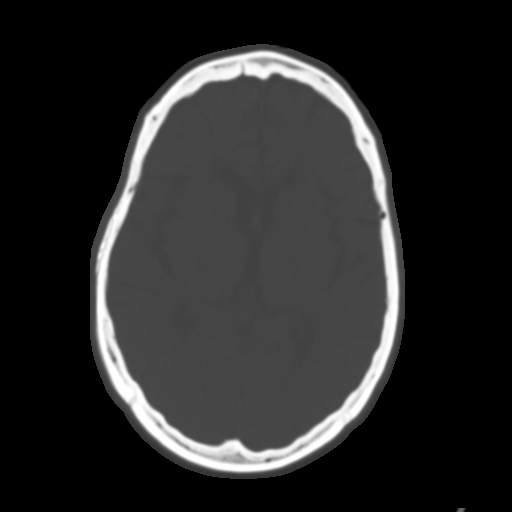
[im 16/29  brain]
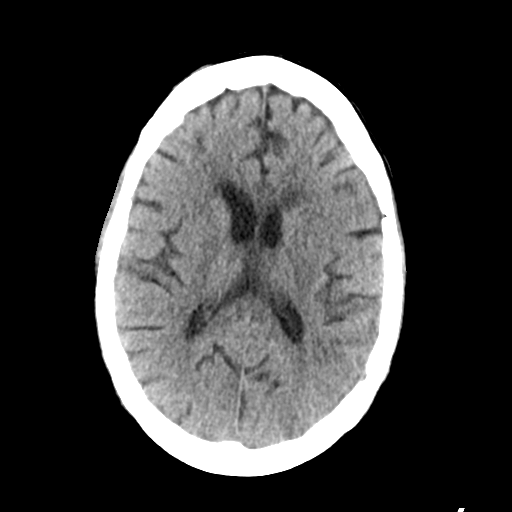
[im 19/29  brain]
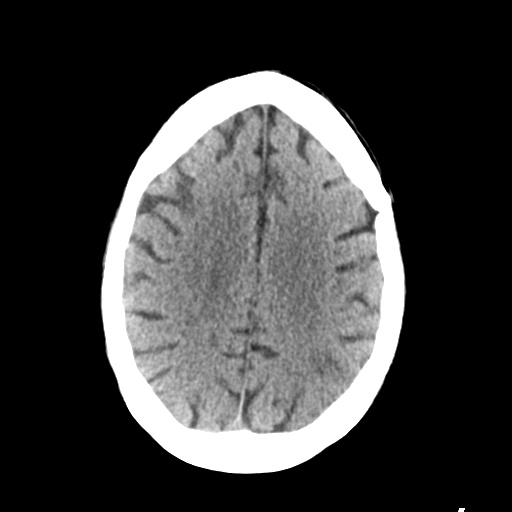
[im 22/29  brain]
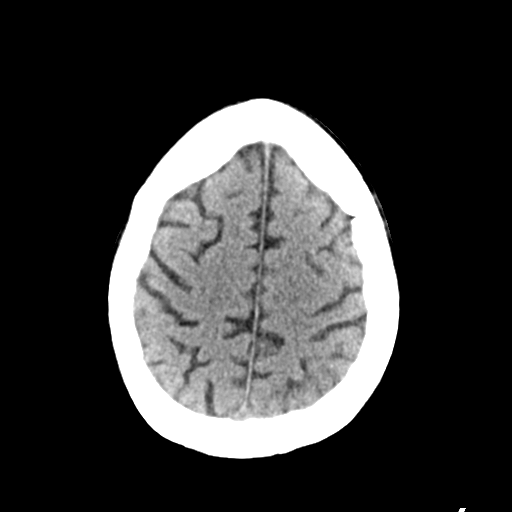
[im 24/29  brain]
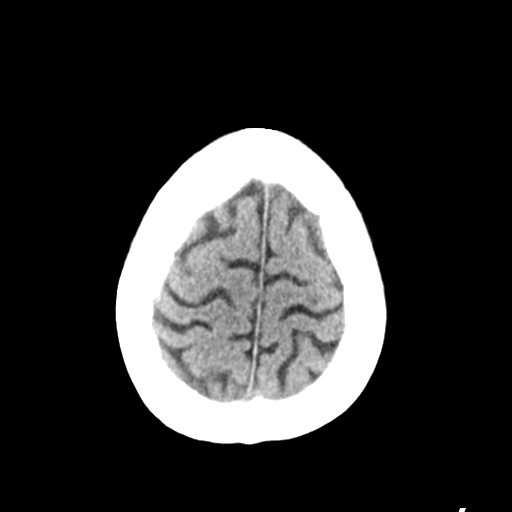
[im 24/29  bone]
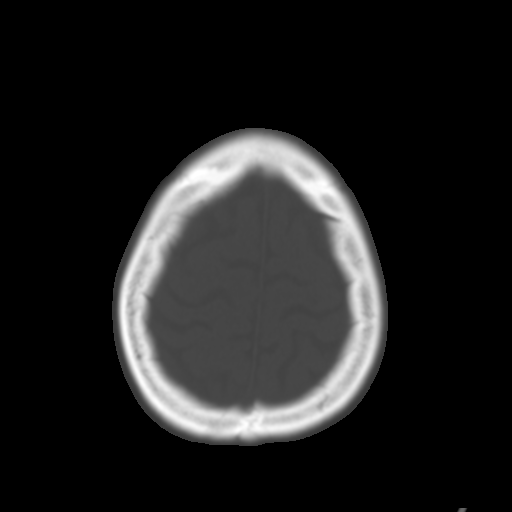
[im 27/29  brain]
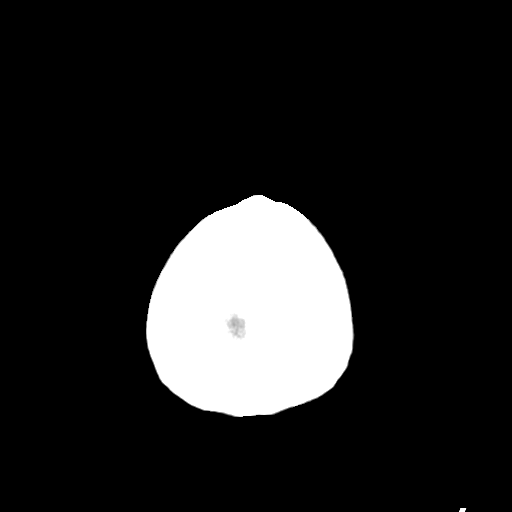

[Series 4: coronal soft tissue · coronal · 0.30mm/px · 3 of 61 slices shown]
[im 21/61  brain]
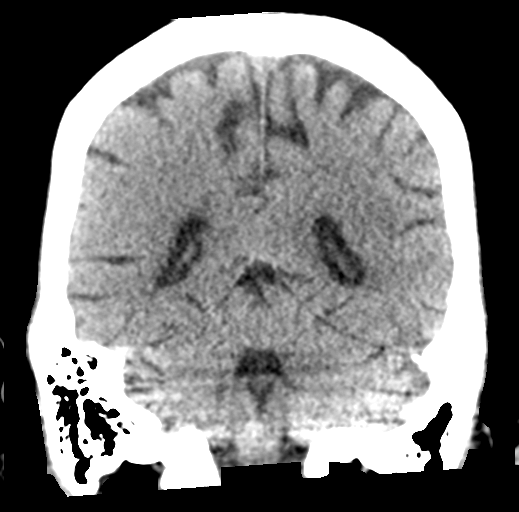
[im 27/61  brain]
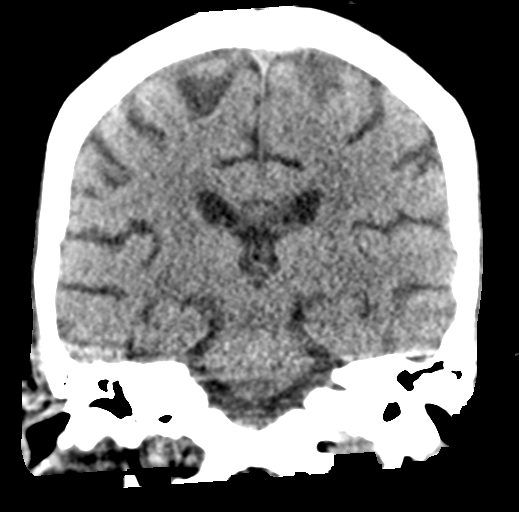
[im 34/61  brain]
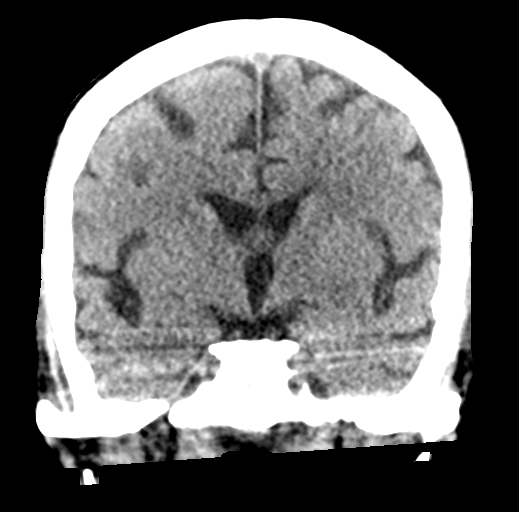

[Series 5: sagittal soft tissue · sagittal · 0.32mm/px · 3 of 48 slices shown]
[im 16/48  brain]
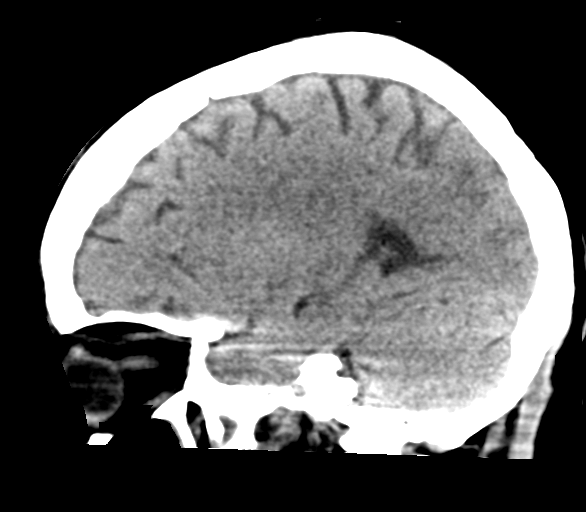
[im 24/48  brain]
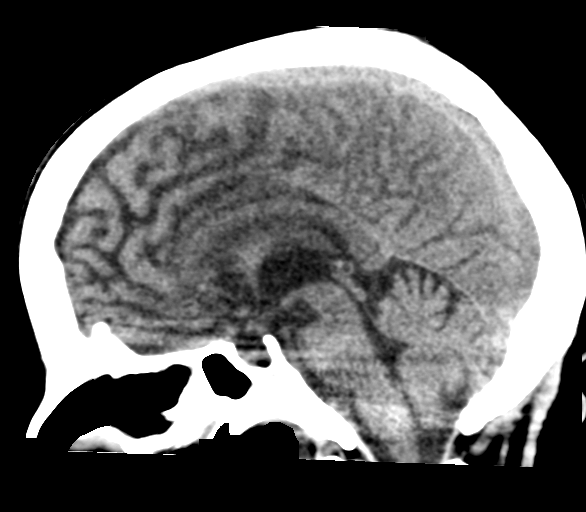
[im 32/48  brain]
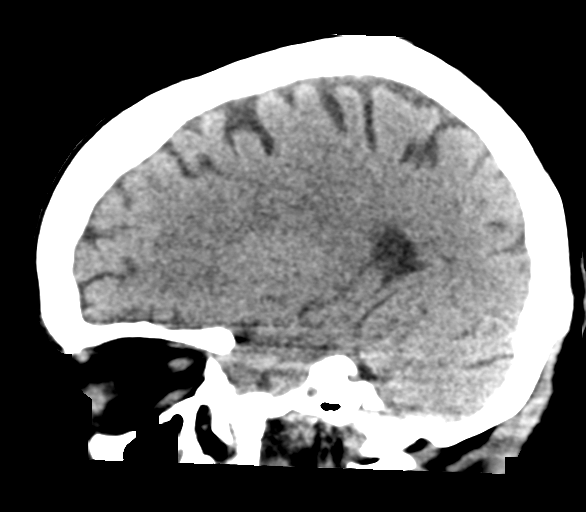

[16 of 46 positions shown; findings below may reference images not displayed]

FINDINGS: BRAIN: A wedge-like hypodensity LEFT parietal lobe (sagittal image
37). Patchy supratentorial white matter hypodensities. Moderate
parenchymal brain volume loss. No intraparenchymal hemorrhage, mass
effect or midline shift. No abnormal extra-axial fluid collections.

VASCULAR: Unremarkable.

SKULL/SOFT TISSUES: No skull fracture. No significant soft tissue
swelling.

ORBITS/SINUSES: Mild paranasal sinus mucosal thickening without
air-fluid levels. Mastoid air cells are well aerated.Trace paranasal
sinus mucosal thickening. Mastoid air cells are well aerated.

OTHER: None.
IMPRESSION: 1. Acute versus subacute LEFT parietal MCA/posterior watershed
territory nonhemorrhagic infarct.
2. Mild chronic small vessel ischemic changes.
3. Moderate parenchymal brain volume loss, advanced for age.
4. These results will be called to the ordering clinician or
representative by the Radiologist Assistant, and communication
documented in the PACS or zVision Dashboard.
